# Patient Record
Sex: Male | Born: 1945 | Race: White | Hispanic: No | Marital: Married | Smoking: Never smoker
Health system: Southern US, Community
[De-identification: ages and names within clinical notes are randomized; demographics above are authoritative.]

## PROBLEM LIST (undated history)

## (undated) DIAGNOSIS — R519 Headache, unspecified: Secondary | ICD-10-CM

## (undated) DIAGNOSIS — I10 Essential (primary) hypertension: Secondary | ICD-10-CM

## (undated) DIAGNOSIS — M1990 Unspecified osteoarthritis, unspecified site: Secondary | ICD-10-CM

## (undated) DIAGNOSIS — T8859XA Other complications of anesthesia, initial encounter: Secondary | ICD-10-CM

## (undated) DIAGNOSIS — T4145XA Adverse effect of unspecified anesthetic, initial encounter: Secondary | ICD-10-CM

## (undated) DIAGNOSIS — R51 Headache: Secondary | ICD-10-CM

## (undated) DIAGNOSIS — E785 Hyperlipidemia, unspecified: Secondary | ICD-10-CM

## (undated) DIAGNOSIS — I4891 Unspecified atrial fibrillation: Secondary | ICD-10-CM

## (undated) DIAGNOSIS — I499 Cardiac arrhythmia, unspecified: Secondary | ICD-10-CM

## (undated) HISTORY — PX: NO PAST SURGERIES: SHX2092

## (undated) HISTORY — PX: COLONOSCOPY: SHX174

---

## 2002-06-08 ENCOUNTER — Ambulatory Visit (HOSPITAL_COMMUNITY): Admission: RE | Admit: 2002-06-08 | Discharge: 2002-06-08 | Payer: Self-pay | Admitting: *Deleted

## 2007-07-26 ENCOUNTER — Encounter: Admission: RE | Admit: 2007-07-26 | Discharge: 2007-07-26 | Payer: Self-pay | Admitting: Internal Medicine

## 2007-08-30 ENCOUNTER — Ambulatory Visit (HOSPITAL_COMMUNITY): Admission: RE | Admit: 2007-08-30 | Discharge: 2007-08-30 | Payer: Self-pay | Admitting: *Deleted

## 2010-05-20 NOTE — Op Note (Signed)
Joshua Shields, Joshua Shields                ACCOUNT NO.:  [id number]   MEDICAL RECORD NO.:  [id number]          PATIENT TYPE:  AMB   LOCATION:  ENDO                         FACILITY:  [facility]   PHYSICIAN:  Georgiana Spinner, M.D.    DATE OF BIRTH:  03-11-1945   DATE OF PROCEDURE:  08/30/2007  DATE OF DISCHARGE:                               OPERATIVE REPORT   PROCEDURE:  Colonoscopy.   INDICATIONS:  Colon cancer screening.   ANESTHESIA:  Fentanyl 50 mcg, Versed 6 mg.   PROCEDURE:  With the patient mildly sedated in the left lateral  decubitus position, rectal examination was performed which was  unremarkable. Prostate felt normal. Subsequently, the Pentax videoscopic  colonoscope was inserted in the rectum and passed under direct vision  with pressure to reach the cecum, identified by the ileocecal valve and  appendiceal oropharynx, both of which were photographed. From this  point, the colonoscope was slowly withdrawn, taking circumferential  views of the colonic mucosa, stopping only in the rectum which appeared  normal on direct and showed hemorrhoidal tissue on retroflexed view. The  endoscope was then straightened and withdrawn. The patient's vital signs  and pulse oximetry remained stable. The patient tolerated the procedure  well without apparent complications.   FINDINGS:  Internal hemorrhoids. Otherwise, an unremarkable colonoscopic  examination to the cecum.   PLAN:  Consider repeat examination in 5-10 years.           ______________________________  Georgiana Spinner, M.D.     GMO/MEDQ  D:  08/30/2007  T:  08/30/2007  Job:  [id number]

## 2010-05-23 NOTE — Op Note (Signed)
   Joshua Shields, Joshua Shields                            ACCOUNT NO.:  [id number]   MEDICAL RECORD NO.:  [id number]                   PATIENT TYPE:  AMB   LOCATION:  ENDO                                 FACILITY:  [facility]   PHYSICIAN:  Georgiana Spinner, M.D.                 DATE OF BIRTH:  Oct 04, 1945   DATE OF PROCEDURE:  06/08/2002  DATE OF DISCHARGE:                                 OPERATIVE REPORT   PROCEDURE PERFORMED:  Colonoscopy for screening.   ENDOSCOPIST:  Georgiana Spinner, M.D.   ANESTHESIA:  Demerol 50 mg, Versed 5 mg.   DESCRIPTION OF PROCEDURE:  With the patient mildly sedated in the left  lateral decubitus position, the Olympus video colonoscope was inserted in  the rectum after a normal rectal exam and passed under direct vision to the  cecum, identified by the ileocecal valve and appendiceal orifice, both of  which were photographed.  From this point the colonoscope was slowly  withdrawn taking circumferential views of the entire colonic mucosa stopping  only in the rectum which appeared normal on direct and showed hemorrhoids on  retroflex view.  The endoscope was straightened and withdrawn.  The  patient's vital signs and pulse oximeter remained stable.  The patient  tolerated the procedure well without apparent complications.   FINDINGS:  Internal hemorrhoids.  Otherwise unremarkable examination.   PLAN:  Repeat examination in five to 10 years.                                                 Georgiana Spinner, M.D.    GMO/MEDQ  D:  06/08/2002  T:  06/08/2002  Job:  [id number]

## 2013-08-09 DIAGNOSIS — Z85828 Personal history of other malignant neoplasm of skin: Secondary | ICD-10-CM | POA: Diagnosis not present

## 2013-08-09 DIAGNOSIS — IMO0002 Reserved for concepts with insufficient information to code with codable children: Secondary | ICD-10-CM | POA: Diagnosis not present

## 2013-08-09 DIAGNOSIS — L570 Actinic keratosis: Secondary | ICD-10-CM | POA: Diagnosis not present

## 2013-08-09 DIAGNOSIS — L821 Other seborrheic keratosis: Secondary | ICD-10-CM | POA: Diagnosis not present

## 2014-08-15 DIAGNOSIS — Z85828 Personal history of other malignant neoplasm of skin: Secondary | ICD-10-CM | POA: Diagnosis not present

## 2014-08-15 DIAGNOSIS — D1801 Hemangioma of skin and subcutaneous tissue: Secondary | ICD-10-CM | POA: Diagnosis not present

## 2014-08-15 DIAGNOSIS — L821 Other seborrheic keratosis: Secondary | ICD-10-CM | POA: Diagnosis not present

## 2014-08-15 DIAGNOSIS — L570 Actinic keratosis: Secondary | ICD-10-CM | POA: Diagnosis not present

## 2014-11-20 DIAGNOSIS — R002 Palpitations: Secondary | ICD-10-CM | POA: Diagnosis not present

## 2014-12-04 DIAGNOSIS — E7800 Pure hypercholesterolemia, unspecified: Secondary | ICD-10-CM | POA: Diagnosis not present

## 2014-12-04 DIAGNOSIS — I10 Essential (primary) hypertension: Secondary | ICD-10-CM | POA: Diagnosis not present

## 2014-12-04 DIAGNOSIS — Z125 Encounter for screening for malignant neoplasm of prostate: Secondary | ICD-10-CM | POA: Diagnosis not present

## 2014-12-19 DIAGNOSIS — Z0000 Encounter for general adult medical examination without abnormal findings: Secondary | ICD-10-CM | POA: Diagnosis not present

## 2014-12-19 DIAGNOSIS — Z23 Encounter for immunization: Secondary | ICD-10-CM | POA: Diagnosis not present

## 2014-12-19 DIAGNOSIS — E7800 Pure hypercholesterolemia, unspecified: Secondary | ICD-10-CM | POA: Diagnosis not present

## 2014-12-19 DIAGNOSIS — R002 Palpitations: Secondary | ICD-10-CM | POA: Diagnosis not present

## 2014-12-19 DIAGNOSIS — I10 Essential (primary) hypertension: Secondary | ICD-10-CM | POA: Diagnosis not present

## 2014-12-26 DIAGNOSIS — I10 Essential (primary) hypertension: Secondary | ICD-10-CM | POA: Diagnosis not present

## 2014-12-26 DIAGNOSIS — R002 Palpitations: Secondary | ICD-10-CM | POA: Diagnosis not present

## 2014-12-26 DIAGNOSIS — Z6829 Body mass index (BMI) 29.0-29.9, adult: Secondary | ICD-10-CM | POA: Diagnosis not present

## 2015-01-02 DIAGNOSIS — I10 Essential (primary) hypertension: Secondary | ICD-10-CM | POA: Diagnosis not present

## 2015-01-02 DIAGNOSIS — R002 Palpitations: Secondary | ICD-10-CM | POA: Diagnosis not present

## 2015-01-09 DIAGNOSIS — R002 Palpitations: Secondary | ICD-10-CM | POA: Diagnosis not present

## 2015-01-18 DIAGNOSIS — R002 Palpitations: Secondary | ICD-10-CM | POA: Diagnosis not present

## 2015-01-28 DIAGNOSIS — I480 Paroxysmal atrial fibrillation: Secondary | ICD-10-CM | POA: Diagnosis not present

## 2015-01-28 DIAGNOSIS — I472 Ventricular tachycardia: Secondary | ICD-10-CM | POA: Diagnosis not present

## 2015-01-28 DIAGNOSIS — I471 Supraventricular tachycardia: Secondary | ICD-10-CM | POA: Diagnosis not present

## 2015-01-28 DIAGNOSIS — I10 Essential (primary) hypertension: Secondary | ICD-10-CM | POA: Diagnosis not present

## 2015-02-22 DIAGNOSIS — I4891 Unspecified atrial fibrillation: Secondary | ICD-10-CM | POA: Diagnosis not present

## 2015-03-13 DIAGNOSIS — I10 Essential (primary) hypertension: Secondary | ICD-10-CM | POA: Diagnosis not present

## 2015-03-13 DIAGNOSIS — E785 Hyperlipidemia, unspecified: Secondary | ICD-10-CM | POA: Diagnosis not present

## 2015-03-13 DIAGNOSIS — J329 Chronic sinusitis, unspecified: Secondary | ICD-10-CM | POA: Diagnosis not present

## 2015-03-14 DIAGNOSIS — I10 Essential (primary) hypertension: Secondary | ICD-10-CM | POA: Diagnosis not present

## 2015-03-20 DIAGNOSIS — Z1389 Encounter for screening for other disorder: Secondary | ICD-10-CM | POA: Diagnosis not present

## 2015-03-20 DIAGNOSIS — E785 Hyperlipidemia, unspecified: Secondary | ICD-10-CM | POA: Diagnosis not present

## 2015-03-20 DIAGNOSIS — I10 Essential (primary) hypertension: Secondary | ICD-10-CM | POA: Diagnosis not present

## 2015-03-20 DIAGNOSIS — R002 Palpitations: Secondary | ICD-10-CM | POA: Diagnosis not present

## 2015-03-25 DIAGNOSIS — I10 Essential (primary) hypertension: Secondary | ICD-10-CM | POA: Diagnosis not present

## 2015-03-25 DIAGNOSIS — I472 Ventricular tachycardia: Secondary | ICD-10-CM | POA: Diagnosis not present

## 2015-03-25 DIAGNOSIS — I471 Supraventricular tachycardia: Secondary | ICD-10-CM | POA: Diagnosis not present

## 2015-03-25 DIAGNOSIS — I480 Paroxysmal atrial fibrillation: Secondary | ICD-10-CM | POA: Diagnosis not present

## 2015-07-29 DIAGNOSIS — I10 Essential (primary) hypertension: Secondary | ICD-10-CM | POA: Diagnosis not present

## 2015-07-29 DIAGNOSIS — I480 Paroxysmal atrial fibrillation: Secondary | ICD-10-CM | POA: Diagnosis not present

## 2015-07-29 DIAGNOSIS — I471 Supraventricular tachycardia: Secondary | ICD-10-CM | POA: Diagnosis not present

## 2015-07-29 DIAGNOSIS — I472 Ventricular tachycardia: Secondary | ICD-10-CM | POA: Diagnosis not present

## 2015-07-29 DIAGNOSIS — I4891 Unspecified atrial fibrillation: Secondary | ICD-10-CM

## 2015-07-29 HISTORY — DX: Unspecified atrial fibrillation: I48.91

## 2015-08-13 DIAGNOSIS — L820 Inflamed seborrheic keratosis: Secondary | ICD-10-CM | POA: Diagnosis not present

## 2015-08-13 DIAGNOSIS — L570 Actinic keratosis: Secondary | ICD-10-CM | POA: Diagnosis not present

## 2015-08-13 DIAGNOSIS — D1801 Hemangioma of skin and subcutaneous tissue: Secondary | ICD-10-CM | POA: Diagnosis not present

## 2015-08-13 DIAGNOSIS — Z85828 Personal history of other malignant neoplasm of skin: Secondary | ICD-10-CM | POA: Diagnosis not present

## 2015-08-13 DIAGNOSIS — L821 Other seborrheic keratosis: Secondary | ICD-10-CM | POA: Diagnosis not present

## 2015-09-13 DIAGNOSIS — R002 Palpitations: Secondary | ICD-10-CM | POA: Diagnosis not present

## 2015-09-13 DIAGNOSIS — I10 Essential (primary) hypertension: Secondary | ICD-10-CM | POA: Diagnosis not present

## 2015-09-13 DIAGNOSIS — E785 Hyperlipidemia, unspecified: Secondary | ICD-10-CM | POA: Diagnosis not present

## 2015-09-20 DIAGNOSIS — Z23 Encounter for immunization: Secondary | ICD-10-CM | POA: Diagnosis not present

## 2015-09-20 DIAGNOSIS — E7800 Pure hypercholesterolemia, unspecified: Secondary | ICD-10-CM | POA: Diagnosis not present

## 2015-09-20 DIAGNOSIS — Z125 Encounter for screening for malignant neoplasm of prostate: Secondary | ICD-10-CM | POA: Diagnosis not present

## 2015-09-20 DIAGNOSIS — I10 Essential (primary) hypertension: Secondary | ICD-10-CM | POA: Diagnosis not present

## 2015-10-09 ENCOUNTER — Other Ambulatory Visit (INDEPENDENT_AMBULATORY_CARE_PROVIDER_SITE_OTHER): Payer: Self-pay | Admitting: Otolaryngology

## 2015-10-09 DIAGNOSIS — J0111 Acute recurrent frontal sinusitis: Secondary | ICD-10-CM | POA: Diagnosis not present

## 2015-10-09 DIAGNOSIS — J328 Other chronic sinusitis: Secondary | ICD-10-CM

## 2015-10-09 DIAGNOSIS — J0101 Acute recurrent maxillary sinusitis: Secondary | ICD-10-CM | POA: Diagnosis not present

## 2015-10-09 DIAGNOSIS — J310 Chronic rhinitis: Secondary | ICD-10-CM | POA: Diagnosis not present

## 2015-10-16 ENCOUNTER — Ambulatory Visit
Admission: RE | Admit: 2015-10-16 | Discharge: 2015-10-16 | Disposition: A | Discharge status: Home / Self Care | Payer: Medicare Other | Source: Ambulatory Visit | Attending: Otolaryngology | Admitting: Otolaryngology

## 2015-10-16 DIAGNOSIS — J328 Other chronic sinusitis: Secondary | ICD-10-CM | POA: Diagnosis not present

## 2015-10-23 ENCOUNTER — Other Ambulatory Visit (INDEPENDENT_AMBULATORY_CARE_PROVIDER_SITE_OTHER): Payer: Self-pay | Admitting: Otolaryngology

## 2015-10-23 DIAGNOSIS — J329 Chronic sinusitis, unspecified: Secondary | ICD-10-CM

## 2015-10-23 DIAGNOSIS — J320 Chronic maxillary sinusitis: Secondary | ICD-10-CM | POA: Diagnosis not present

## 2015-10-23 DIAGNOSIS — J322 Chronic ethmoidal sinusitis: Secondary | ICD-10-CM | POA: Diagnosis not present

## 2015-10-23 DIAGNOSIS — J321 Chronic frontal sinusitis: Secondary | ICD-10-CM | POA: Diagnosis not present

## 2015-10-28 ENCOUNTER — Other Ambulatory Visit: Payer: Self-pay | Admitting: Otolaryngology

## 2015-10-30 ENCOUNTER — Ambulatory Visit
Admission: RE | Admit: 2015-10-30 | Discharge: 2015-10-30 | Disposition: A | Discharge status: Home / Self Care | Payer: Medicare Other | Source: Ambulatory Visit | Attending: Otolaryngology | Admitting: Otolaryngology

## 2015-10-30 DIAGNOSIS — J329 Chronic sinusitis, unspecified: Secondary | ICD-10-CM

## 2015-11-12 ENCOUNTER — Encounter (HOSPITAL_BASED_OUTPATIENT_CLINIC_OR_DEPARTMENT_OTHER): Payer: Self-pay | Admitting: *Deleted

## 2015-11-12 NOTE — Progress Notes (Signed)
Patient is on Xarelto 20mg  daily for A-fib. Per Dr Woody Seller his cardiologist, OK to stop Xarelto 5d prior to surgery.

## 2015-11-18 ENCOUNTER — Ambulatory Visit (HOSPITAL_BASED_OUTPATIENT_CLINIC_OR_DEPARTMENT_OTHER): Payer: Medicare Other | Admitting: Anesthesiology

## 2015-11-18 ENCOUNTER — Observation Stay (HOSPITAL_BASED_OUTPATIENT_CLINIC_OR_DEPARTMENT_OTHER)
Admission: RE | Admit: 2015-11-18 | Discharge: 2015-11-19 | Disposition: A | Discharge status: Home / Self Care | Payer: Medicare Other | Source: Ambulatory Visit | Attending: Emergency Medicine | Admitting: Emergency Medicine

## 2015-11-18 ENCOUNTER — Observation Stay (HOSPITAL_BASED_OUTPATIENT_CLINIC_OR_DEPARTMENT_OTHER): Payer: Medicare Other

## 2015-11-18 ENCOUNTER — Encounter (HOSPITAL_COMMUNITY)
Admission: RE | Disposition: A | Discharge status: Home / Self Care | Payer: Self-pay | Source: Ambulatory Visit | Attending: Emergency Medicine

## 2015-11-18 ENCOUNTER — Encounter (HOSPITAL_BASED_OUTPATIENT_CLINIC_OR_DEPARTMENT_OTHER): Payer: Self-pay

## 2015-11-18 ENCOUNTER — Observation Stay (HOSPITAL_COMMUNITY): Payer: Medicare Other

## 2015-11-18 DIAGNOSIS — J329 Chronic sinusitis, unspecified: Secondary | ICD-10-CM | POA: Diagnosis present

## 2015-11-18 DIAGNOSIS — I471 Supraventricular tachycardia: Secondary | ICD-10-CM | POA: Insufficient documentation

## 2015-11-18 DIAGNOSIS — J321 Chronic frontal sinusitis: Secondary | ICD-10-CM | POA: Diagnosis not present

## 2015-11-18 DIAGNOSIS — J343 Hypertrophy of nasal turbinates: Secondary | ICD-10-CM | POA: Insufficient documentation

## 2015-11-18 DIAGNOSIS — J322 Chronic ethmoidal sinusitis: Principal | ICD-10-CM | POA: Insufficient documentation

## 2015-11-18 DIAGNOSIS — I469 Cardiac arrest, cause unspecified: Secondary | ICD-10-CM | POA: Insufficient documentation

## 2015-11-18 DIAGNOSIS — Z7951 Long term (current) use of inhaled steroids: Secondary | ICD-10-CM | POA: Insufficient documentation

## 2015-11-18 DIAGNOSIS — I10 Essential (primary) hypertension: Secondary | ICD-10-CM | POA: Insufficient documentation

## 2015-11-18 DIAGNOSIS — I480 Paroxysmal atrial fibrillation: Secondary | ICD-10-CM

## 2015-11-18 DIAGNOSIS — I959 Hypotension, unspecified: Secondary | ICD-10-CM | POA: Diagnosis not present

## 2015-11-18 DIAGNOSIS — J320 Chronic maxillary sinusitis: Secondary | ICD-10-CM | POA: Diagnosis not present

## 2015-11-18 DIAGNOSIS — Z79899 Other long term (current) drug therapy: Secondary | ICD-10-CM | POA: Diagnosis not present

## 2015-11-18 DIAGNOSIS — E7800 Pure hypercholesterolemia, unspecified: Secondary | ICD-10-CM

## 2015-11-18 DIAGNOSIS — Z7901 Long term (current) use of anticoagulants: Secondary | ICD-10-CM | POA: Insufficient documentation

## 2015-11-18 DIAGNOSIS — R001 Bradycardia, unspecified: Secondary | ICD-10-CM | POA: Diagnosis not present

## 2015-11-18 DIAGNOSIS — I9581 Postprocedural hypotension: Secondary | ICD-10-CM | POA: Diagnosis not present

## 2015-11-18 DIAGNOSIS — E785 Hyperlipidemia, unspecified: Secondary | ICD-10-CM

## 2015-11-18 DIAGNOSIS — I4891 Unspecified atrial fibrillation: Secondary | ICD-10-CM | POA: Diagnosis not present

## 2015-11-18 DIAGNOSIS — R0789 Other chest pain: Secondary | ICD-10-CM | POA: Diagnosis not present

## 2015-11-18 DIAGNOSIS — R0981 Nasal congestion: Secondary | ICD-10-CM

## 2015-11-18 DIAGNOSIS — R079 Chest pain, unspecified: Secondary | ICD-10-CM | POA: Diagnosis not present

## 2015-11-18 HISTORY — DX: Unspecified osteoarthritis, unspecified site: M19.90

## 2015-11-18 HISTORY — DX: Cardiac arrhythmia, unspecified: I49.9

## 2015-11-18 HISTORY — DX: Unspecified atrial fibrillation: I48.91

## 2015-11-18 HISTORY — DX: Hyperlipidemia, unspecified: E78.5

## 2015-11-18 HISTORY — DX: Essential (primary) hypertension: I10

## 2015-11-18 LAB — COMPREHENSIVE METABOLIC PANEL
ALBUMIN: 3.7 g/dL (ref 3.5–5.0)
ALK PHOS: 38 U/L (ref 38–126)
ALT: 33 U/L (ref 17–63)
ANION GAP: 13 (ref 5–15)
AST: 39 U/L (ref 15–41)
BILIRUBIN TOTAL: 1.2 mg/dL (ref 0.3–1.2)
BUN: 19 mg/dL (ref 6–20)
CALCIUM: 8.6 mg/dL — AB (ref 8.9–10.3)
CO2: 25 mmol/L (ref 22–32)
Chloride: 102 mmol/L (ref 101–111)
Creatinine, Ser: 1.21 mg/dL (ref 0.61–1.24)
GFR calc Af Amer: >60 mL/min (ref >60)
GFR, EST NON AFRICAN AMERICAN: 59 mL/min — AB (ref >60)
GLUCOSE: 107 mg/dL — AB (ref 65–99)
Potassium: 4 mmol/L (ref 3.5–5.1)
Sodium: 140 mmol/L (ref 135–145)
TOTAL PROTEIN: 6.5 g/dL (ref 6.5–8.1)

## 2015-11-18 LAB — CBC
HEMATOCRIT: 45.1 % (ref 39.0–52.0)
Hemoglobin: 15.6 g/dL (ref 13.0–17.0)
MCH: 33.6 pg (ref 26.0–34.0)
MCHC: 34.6 g/dL (ref 30.0–36.0)
MCV: 97.2 fL (ref 78.0–100.0)
Platelets: 193 10*3/uL (ref 150–400)
RBC: 4.64 MIL/uL (ref 4.22–5.81)
RDW: 12.9 % (ref 11.5–15.5)
WBC: 12.4 10*3/uL — AB (ref 4.0–10.5)

## 2015-11-18 LAB — ECHOCARDIOGRAM COMPLETE
HEIGHTINCHES: 72 in
Weight: 3464 oz

## 2015-11-18 LAB — TROPONIN I
TROPONIN I: 0.27 ng/mL — AB (ref <0.03)
Troponin I: <0.03 ng/mL (ref <0.03)
Troponin I: 0.15 ng/mL (ref <0.03)

## 2015-11-18 LAB — POCT I-STAT, CHEM 8
BUN: 22 mg/dL — ABNORMAL HIGH (ref 6–20)
BUN: 24 mg/dL — ABNORMAL HIGH (ref 6–20)
CALCIUM ION: 1.19 mmol/L (ref 1.15–1.40)
CALCIUM ION: 1.2 mmol/L (ref 1.15–1.40)
CHLORIDE: 103 mmol/L (ref 101–111)
Chloride: 102 mmol/L (ref 101–111)
Creatinine, Ser: 1 mg/dL (ref 0.61–1.24)
Creatinine, Ser: 1.2 mg/dL (ref 0.61–1.24)
GLUCOSE: 94 mg/dL (ref 65–99)
GLUCOSE: 111 mg/dL — AB (ref 65–99)
HCT: 44 % (ref 39.0–52.0)
HCT: 45 % (ref 39.0–52.0)
Hemoglobin: 15 g/dL (ref 13.0–17.0)
Hemoglobin: 15.3 g/dL (ref 13.0–17.0)
Potassium: 4.4 mmol/L (ref 3.5–5.1)
Potassium: 3.8 mmol/L (ref 3.5–5.1)
SODIUM: 139 mmol/L (ref 135–145)
SODIUM: 141 mmol/L (ref 135–145)
TCO2: 27 mmol/L (ref 0–100)
TCO2: 29 mmol/L (ref 0–100)

## 2015-11-18 LAB — I-STAT TROPONIN, ED: Troponin i, poc: 0.06 ng/mL (ref 0.00–0.08)

## 2015-11-18 LAB — HEPARIN LEVEL (UNFRACTIONATED)

## 2015-11-18 LAB — URINALYSIS, ROUTINE W REFLEX MICROSCOPIC
Bilirubin Urine: NEGATIVE
GLUCOSE, UA: NEGATIVE mg/dL
HGB URINE DIPSTICK: NEGATIVE
Ketones, ur: NEGATIVE mg/dL
Leukocytes, UA: NEGATIVE
Nitrite: NEGATIVE
Protein, ur: 30 mg/dL — AB
SPECIFIC GRAVITY, URINE: 1.005 (ref 1.005–1.030)
pH: 6 (ref 5.0–8.0)

## 2015-11-18 LAB — URINE MICROSCOPIC-ADD ON
Bacteria, UA: NONE SEEN
RBC / HPF: NONE SEEN RBC/hpf (ref 0–5)
SQUAMOUS EPITHELIAL / LPF: NONE SEEN

## 2015-11-18 LAB — TSH: TSH: 1.376 u[IU]/mL (ref 0.350–4.500)

## 2015-11-18 LAB — APTT
aPTT: 26 seconds (ref 24–36)
aPTT: 52 seconds — ABNORMAL HIGH (ref 24–36)

## 2015-11-18 LAB — PHOSPHORUS: PHOSPHORUS: 3.7 mg/dL (ref 2.5–4.6)

## 2015-11-18 LAB — MAGNESIUM: Magnesium: 2 mg/dL (ref 1.7–2.4)

## 2015-11-18 SURGERY — CANCELLED PROCEDURE
Anesthesia: General | Laterality: Right

## 2015-11-18 MED ORDER — ASPIRIN 81 MG PO CHEW
324.0000 mg | CHEWABLE_TABLET | Freq: Once | ORAL | Status: AC
  Administered 2015-11-18: 324 mg via ORAL
  Filled 2015-11-18: qty 4

## 2015-11-18 MED ORDER — LIDOCAINE HCL (CARDIAC) 20 MG/ML IV SOLN
INTRAVENOUS | Status: DC | PRN
Start: 2015-11-18 — End: 2015-11-18
  Administered 2015-11-18 (×2): 100 mg via INTRAVENOUS

## 2015-11-18 MED ORDER — FENTANYL CITRATE (PF) 100 MCG/2ML IJ SOLN
INTRAMUSCULAR | Status: AC
  Filled 2015-11-18: qty 2

## 2015-11-18 MED ORDER — FENTANYL CITRATE (PF) 100 MCG/2ML IJ SOLN: 50.0000 ug | INTRAMUSCULAR | Status: DC | PRN

## 2015-11-18 MED ORDER — CEFAZOLIN SODIUM-DEXTROSE 2-4 GM/100ML-% IV SOLN
INTRAVENOUS | Status: AC
  Filled 2015-11-18: qty 100

## 2015-11-18 MED ORDER — ASPIRIN EC 325 MG PO TBEC
325.0000 mg | DELAYED_RELEASE_TABLET | Freq: Every day | ORAL | Status: DC
  Administered 2015-11-19: 325 mg via ORAL
  Filled 2015-11-18: qty 1

## 2015-11-18 MED ORDER — ACETAMINOPHEN 650 MG RE SUPP: 650.0000 mg | Freq: Four times a day (QID) | RECTAL | Status: DC | PRN

## 2015-11-18 MED ORDER — SUCCINYLCHOLINE CHLORIDE 20 MG/ML IJ SOLN
INTRAMUSCULAR | Status: DC | PRN
  Administered 2015-11-18: 100 mg via INTRAVENOUS

## 2015-11-18 MED ORDER — OXYCODONE HCL 5 MG PO TABS
5.0000 mg | ORAL_TABLET | ORAL | Status: DC | PRN
  Administered 2015-11-18: 5 mg via ORAL
  Filled 2015-11-18: qty 1

## 2015-11-18 MED ORDER — SODIUM CHLORIDE 0.9% FLUSH
3.0000 mL | Freq: Two times a day (BID) | INTRAVENOUS | Status: DC
  Administered 2015-11-18: 3 mL via INTRAVENOUS

## 2015-11-18 MED ORDER — HEPARIN (PORCINE) IN NACL 100-0.45 UNIT/ML-% IJ SOLN
1500.0000 [IU]/kg/h | INTRAMUSCULAR | Status: DC
  Administered 2015-11-18: 14 [IU]/kg/h via INTRAVENOUS
  Filled 2015-11-18: qty 250

## 2015-11-18 MED ORDER — SODIUM CHLORIDE 0.9 % IV BOLUS (SEPSIS): 1000.0000 mL | Freq: Once | INTRAVENOUS | Status: DC

## 2015-11-18 MED ORDER — LIDOCAINE VISCOUS 2 % MT SOLN
15.0000 mL | Freq: Once | OROMUCOSAL | Status: AC
  Administered 2015-11-18: 15 mL via OROMUCOSAL
  Filled 2015-11-18: qty 15

## 2015-11-18 MED ORDER — CLINDAMYCIN PHOSPHATE 900 MG/50ML IV SOLN
INTRAVENOUS | Status: AC
  Filled 2015-11-18: qty 50

## 2015-11-18 MED ORDER — SODIUM CHLORIDE 0.45 % IV SOLN
INTRAVENOUS | Status: DC
  Administered 2015-11-18 – 2015-11-19 (×2): via INTRAVENOUS

## 2015-11-18 MED ORDER — PERFLUTREN LIPID MICROSPHERE
1.0000 mL | INTRAVENOUS | Status: AC | PRN
  Administered 2015-11-18: 2 mL via INTRAVENOUS
  Filled 2015-11-18: qty 10

## 2015-11-18 MED ORDER — DICLOFENAC SODIUM 1 % TD GEL
4.0000 g | Freq: Four times a day (QID) | TRANSDERMAL | Status: DC | PRN
  Filled 2015-11-18: qty 100

## 2015-11-18 MED ORDER — FENTANYL CITRATE (PF) 100 MCG/2ML IJ SOLN
INTRAMUSCULAR | Status: DC | PRN
  Administered 2015-11-18: 100 ug via INTRAVENOUS

## 2015-11-18 MED ORDER — SIMVASTATIN 20 MG PO TABS
20.0000 mg | ORAL_TABLET | Freq: Every day | ORAL | Status: DC
  Administered 2015-11-18: 20 mg via ORAL
  Filled 2015-11-18: qty 1

## 2015-11-18 MED ORDER — MIDAZOLAM HCL 2 MG/2ML IJ SOLN: 1.0000 mg | INTRAMUSCULAR | Status: DC | PRN

## 2015-11-18 MED ORDER — FENTANYL CITRATE (PF) 100 MCG/2ML IJ SOLN
50.0000 ug | INTRAMUSCULAR | Status: DC | PRN
  Administered 2015-11-18 – 2015-11-19 (×8): 50 ug via INTRAVENOUS
  Filled 2015-11-18 (×8): qty 2

## 2015-11-18 MED ORDER — HYDRALAZINE HCL 20 MG/ML IJ SOLN: 5.0000 mg | INTRAMUSCULAR | Status: DC | PRN

## 2015-11-18 MED ORDER — MUPIROCIN 2 % EX OINT
TOPICAL_OINTMENT | CUTANEOUS | Status: AC
  Filled 2015-11-18: qty 22

## 2015-11-18 MED ORDER — SALINE SPRAY 0.65 % NA SOLN: 1.0000 | NASAL | Status: DC | PRN

## 2015-11-18 MED ORDER — FLUTICASONE PROPIONATE 50 MCG/ACT NA SUSP
1.0000 | Freq: Every day | NASAL | Status: DC
  Administered 2015-11-19: 1 via NASAL
  Filled 2015-11-18: qty 16

## 2015-11-18 MED ORDER — LACTATED RINGERS IV SOLN
INTRAVENOUS | Status: DC
  Administered 2015-11-18: 09:00:00 via INTRAVENOUS

## 2015-11-18 MED ORDER — ALBUTEROL SULFATE HFA 108 (90 BASE) MCG/ACT IN AERS
INHALATION_SPRAY | RESPIRATORY_TRACT | Status: DC | PRN
  Administered 2015-11-18 (×2): 2 via RESPIRATORY_TRACT

## 2015-11-18 MED ORDER — LIDOCAINE 2% (20 MG/ML) 5 ML SYRINGE
INTRAMUSCULAR | Status: AC
  Filled 2015-11-18: qty 15

## 2015-11-18 MED ORDER — HYDROMORPHONE HCL 1 MG/ML IJ SOLN: 0.2500 mg | INTRAMUSCULAR | Status: DC | PRN

## 2015-11-18 MED ORDER — LEVOFLOXACIN 750 MG PO TABS
750.0000 mg | ORAL_TABLET | Freq: Every day | ORAL | Status: DC
  Administered 2015-11-18 – 2015-11-19 (×2): 750 mg via ORAL
  Filled 2015-11-18 (×2): qty 1

## 2015-11-18 MED ORDER — PROPOFOL 10 MG/ML IV BOLUS
INTRAVENOUS | Status: DC | PRN
Start: 2015-11-18 — End: 2015-11-18
  Administered 2015-11-18: 200 mg via INTRAVENOUS

## 2015-11-18 MED ORDER — EPINEPHRINE PF 1 MG/10ML IJ SOSY
PREFILLED_SYRINGE | INTRAMUSCULAR | Status: AC
  Filled 2015-11-18: qty 20

## 2015-11-18 MED ORDER — KETOROLAC TROMETHAMINE 30 MG/ML IJ SOLN
15.0000 mg | Freq: Once | INTRAMUSCULAR | Status: AC
  Administered 2015-11-18: 15 mg via INTRAVENOUS
  Filled 2015-11-18: qty 1

## 2015-11-18 MED ORDER — EPINEPHRINE PF 1 MG/ML IJ SOLN
INTRAMUSCULAR | Status: DC | PRN
  Administered 2015-11-18 (×2): .5 mg via INTRAVENOUS

## 2015-11-18 MED ORDER — SCOPOLAMINE 1 MG/3DAYS TD PT72: 1.0000 | MEDICATED_PATCH | Freq: Once | TRANSDERMAL | Status: DC | PRN

## 2015-11-18 MED ORDER — ACETAMINOPHEN 500 MG PO TABS
1000.0000 mg | ORAL_TABLET | Freq: Four times a day (QID) | ORAL | Status: DC | PRN
Start: 2015-11-18 — End: 2015-11-19

## 2015-11-18 MED ORDER — LACTATED RINGERS IV SOLN
INTRAVENOUS | Status: DC
  Administered 2015-11-18: 11:00:00 via INTRAVENOUS

## 2015-11-18 MED ORDER — ONDANSETRON HCL 4 MG PO TABS: 4.0000 mg | ORAL_TABLET | Freq: Four times a day (QID) | ORAL | Status: DC | PRN

## 2015-11-18 MED ORDER — ONDANSETRON HCL 4 MG/2ML IJ SOLN: 4.0000 mg | Freq: Four times a day (QID) | INTRAMUSCULAR | Status: DC | PRN

## 2015-11-18 MED FILL — Medication: Qty: 1 | Status: AC

## 2015-11-18 SURGICAL SUPPLY — 55 items
ATTRACTOMAT 16X20 MAGNETIC DRP (DRAPES) IMPLANT
BLADE RAD40 ROTATE 4M 4 5PK (BLADE) IMPLANT
BLADE RAD40 ROTATE 4M 4MM 5PK (BLADE)
BLADE RAD60 ROTATE M4 4 5PK (BLADE) IMPLANT
BLADE RAD60 ROTATE M4 4MM 5PK (BLADE)
BLADE ROTATE RAD 12 4 M4 (BLADE) IMPLANT
BLADE ROTATE RAD 12 4MM M4 (BLADE)
BLADE ROTATE RAD 40 4 M4 (BLADE) IMPLANT
BLADE ROTATE RAD 40 4MM M4 (BLADE)
BLADE ROTATE TRICUT 4MX13CM M4 (BLADE)
BLADE ROTATE TRICUT 4X13 M4 (BLADE) IMPLANT
BLADE TRICUT ROTATE M4 4 5PK (BLADE) IMPLANT
BLADE TRICUT ROTATE M4 4MM 5PK (BLADE)
BUR HS RAD FRONTAL 3 (BURR) IMPLANT
BUR HS RAD FRONTAL 3MM (BURR)
CANISTER SUC SOCK COL 7IN (MISCELLANEOUS) ×6 IMPLANT
CANISTER SUCT 1200ML W/VALVE (MISCELLANEOUS) ×3 IMPLANT
COAGULATOR SUCT 6 FR SWTCH (ELECTROSURGICAL)
COAGULATOR SUCT 8FR VV (MISCELLANEOUS) IMPLANT
COAGULATOR SUCT SWTCH 10FR 6 (ELECTROSURGICAL) IMPLANT
DECANTER SPIKE VIAL GLASS SM (MISCELLANEOUS) IMPLANT
DRSG NASAL KENNEDY LMNT 8CM (GAUZE/BANDAGES/DRESSINGS) IMPLANT
DRSG NASOPORE 8CM (GAUZE/BANDAGES/DRESSINGS) IMPLANT
DRSG TELFA 3X8 NADH (GAUZE/BANDAGES/DRESSINGS) IMPLANT
ELECT REM PT RETURN 9FT ADLT (ELECTROSURGICAL)
ELECTRODE REM PT RTRN 9FT ADLT (ELECTROSURGICAL) IMPLANT
GLOVE BIO SURGEON STRL SZ7.5 (GLOVE) ×3 IMPLANT
GOWN STRL REUS W/ TWL LRG LVL3 (GOWN DISPOSABLE) ×2 IMPLANT
GOWN STRL REUS W/TWL LRG LVL3 (GOWN DISPOSABLE) ×4
HEMOSTAT SURGICEL 2X14 (HEMOSTASIS) IMPLANT
IV NS 1000ML (IV SOLUTION)
IV NS 1000ML BAXH (IV SOLUTION) IMPLANT
IV NS 500ML (IV SOLUTION) ×4
IV NS 500ML BAXH (IV SOLUTION) ×2 IMPLANT
NEEDLE HYPO 25X1 1.5 SAFETY (NEEDLE) ×3 IMPLANT
NEEDLE PRECISIONGLIDE 27X1.5 (NEEDLE) ×3 IMPLANT
NEEDLE SPNL 25GX3.5 QUINCKE BL (NEEDLE) IMPLANT
NS IRRIG 1000ML POUR BTL (IV SOLUTION) IMPLANT
PACK BASIN DAY SURGERY FS (CUSTOM PROCEDURE TRAY) ×3 IMPLANT
PACK ENT DAY SURGERY (CUSTOM PROCEDURE TRAY) ×3 IMPLANT
PACKING NASAL EPIS 4X2.4 XEROG (MISCELLANEOUS) IMPLANT
SLEEVE SCD COMPRESS KNEE MED (MISCELLANEOUS) IMPLANT
SOLUTION BUTLER CLEAR DIP (MISCELLANEOUS) ×6 IMPLANT
SPLINT NASAL AIRWAY SILICONE (MISCELLANEOUS) IMPLANT
SPONGE GAUZE 2X2 8PLY STER LF (GAUZE/BANDAGES/DRESSINGS) ×1
SPONGE GAUZE 2X2 8PLY STRL LF (GAUZE/BANDAGES/DRESSINGS) ×2 IMPLANT
SPONGE NEURO XRAY DETECT 1X3 (DISPOSABLE) ×3 IMPLANT
TOWEL OR 17X24 6PK STRL BLUE (TOWEL DISPOSABLE) ×3 IMPLANT
TRACKER ENT INSTRUMENT (MISCELLANEOUS) IMPLANT
TRACKER ENT PATIENT (MISCELLANEOUS) IMPLANT
TUBE CONNECTING 20'X1/4 (TUBING) ×1
TUBE CONNECTING 20X1/4 (TUBING) ×2 IMPLANT
TUBE SALEM SUMP 16 FR W/ARV (TUBING) IMPLANT
TUBING STRAIGHTSHOT EPS 5PK (TUBING) IMPLANT
YANKAUER SUCT BULB TIP NO VENT (SUCTIONS) ×3 IMPLANT

## 2015-11-18 NOTE — Progress Notes (Signed)
ANTICOAGULATION CONSULT NOTE - UPDATE  Baseline aPTT is normal and heparin level is undetectable indicating Xarelto has been eliminated. Heparin levels only will be used going forward.  Renold Genta, PharmD, BCPS Clinical Pharmacist Phone for tonight - [place] - [phone] 11/18/2015 6:28 PM

## 2015-11-18 NOTE — ED Notes (Signed)
Bolus of LR continued with EDP approval.

## 2015-11-18 NOTE — ED Notes (Signed)
Pt. And family wishing to talk to EDP. EDP made aware.

## 2015-11-18 NOTE — Anesthesia Procedure Notes (Signed)
Procedure Name: Intubation Date/Time: 11/18/2015 9:42 AM Performed by: Lieutenant Diego Pre-anesthesia Checklist: Patient identified, Emergency Drugs available, Suction available and Patient being monitored Patient Re-evaluated:Patient Re-evaluated prior to inductionOxygen Delivery Method: Circle system utilized Preoxygenation: Pre-oxygenation with 100% oxygen Intubation Type: IV induction Ventilation: Mask ventilation without difficulty Laryngoscope Size: Miller and 2 Grade View: Grade I Tube type: Oral Tube size: 7.0 mm Number of attempts: 1 Airway Equipment and Method: Stylet and Oral airway Placement Confirmation: ETT inserted through vocal cords under direct vision,  positive ETCO2 and breath sounds checked- equal and bilateral Secured at: 24 cm Tube secured with: Tape Dental Injury: Teeth and Oropharynx as per pre-operative assessment

## 2015-11-18 NOTE — ED Provider Notes (Signed)
[facility] DEPT Provider Note   CSN: [id number] Arrival date & time: 11/18/15  1028     History   Chief Complaint Chief Complaint  Patient presents with  . Post CPR    HPI Jayquon Sharrett is a 70 y.o. male.  Underwent anesthesia/intubation for elective ethmoidectomy, decreasing BP and HR until no palpable pulse was thought to have occurred (one physician felt one, someone else did not) so CPR started, epi given and patient's BP improved. Pulse felt. Was subsequently woken from anesthesia, extubated and sent to er for eval. On ROS: mild lateral R chest pain, sore throat. No recent illnesses or other complaints.       Past Medical History:  Diagnosis Date  . A-fib ([place]) 07/29/2015  . Arthritis    hand  . Dysrhythmia    a-fib  . Hyperlipidemia   . Hypertension     There are no active problems to display for this patient.   Past Surgical History:  Procedure Laterality Date  . COLONOSCOPY    . NO PAST SURGERIES         Home Medications    Prior to Admission medications   Medication Sig Start Date End Date Taking? Authorizing Provider  acetaminophen (TYLENOL) 500 MG tablet Take 1,000 mg by mouth every 6 (six) hours as needed for headache.   Yes Historical Provider, MD  fluticasone (FLONASE) 50 MCG/ACT nasal spray Place 1 spray into both nostrils daily.    Yes Historical Provider, MD  lisinopril (PRINIVIL,ZESTRIL) 20 MG tablet Take 20 mg by mouth daily.   Yes Historical Provider, MD  metoprolol succinate (TOPROL-XL) 25 MG 24 hr tablet Take 25 mg by mouth daily.   Yes Historical Provider, MD  rivaroxaban (XARELTO) 20 MG TABS tablet Take 20 mg by mouth daily with supper.   Yes Historical Provider, MD  simvastatin (ZOCOR) 20 MG tablet Take 20 mg by mouth daily.   Yes Historical Provider, MD    Family History History reviewed. No pertinent family history.  Social History Social History  Substance Use Topics  . Smoking status: Never Smoker  . Smokeless tobacco:  Never Used  . Alcohol use Yes     Comment: social     Allergies   Penicillins   Review of Systems Review of Systems  All other systems reviewed and are negative.    Physical Exam Updated Vital Signs BP 91/62   Pulse 73   Temp 98 F (36.7 C) (Oral)   Resp 15   Ht 6' (1.829 m)   Wt 214 lb (97.1 kg)   SpO2 98%   BMI 29.02 kg/m   Physical Exam  Constitutional: He appears well-developed and well-nourished.  HENT:  Head: Normocephalic and atraumatic.  Eyes: Conjunctivae and EOM are normal.  Neck: Normal range of motion.  Cardiovascular: Normal rate.   Pulmonary/Chest: Effort normal. No respiratory distress. He exhibits tenderness (Right lateral).  Abdominal: Soft. He exhibits no distension.  Musculoskeletal: Normal range of motion.  Neurological: He is alert.  Skin: Skin is warm and dry.  Nursing note and vitals reviewed.    ED Treatments / Results  Labs (all labs ordered are listed, but only abnormal results are displayed) Labs Reviewed  POCT I-STAT, CHEM 8 - Abnormal; Notable for the following:       Result Value   BUN 22 (*)    All other components within normal limits  POCT I-STAT, CHEM 8 - Abnormal; Notable for the following:    BUN 24 (*)  Glucose, Bld 111 (*)    All other components within normal limits  CBC  COMPREHENSIVE METABOLIC PANEL  TROPONIN I  I-STAT TROPOININ, ED    EKG  EKG Interpretation None       Radiology No results found.  Procedures Procedures (including critical care time)  Medications Ordered in ED Medications  aspirin chewable tablet 324 mg (not administered)  lactated ringers infusion (not administered)  lidocaine (XYLOCAINE) 2 % viscous mouth solution 15 mL (15 mLs Mouth/Throat Given 11/18/15 1101)     Initial Impression / Assessment and Plan / ED Course  I have reviewed the triage vital signs and the nursing notes.  Pertinent labs & imaging results that were available during my care of the patient were  reviewed by me and considered in my medical decision making (see chart for details).  Clinical Course     70 yo M here s/p CPR/epinephrine. Awake alert. Right sided MSK pain and sore throat likely from CPR and intubation. ECG ok. cxr ok. Labs ok. Troponin ok.  Initially low BP's here but improved with fluids.  Suspect hypotension and thus had thready/no pulse and that as the cause for his CPR, however it is difficult to know so I d/w his cardiologist who will see in hospital, admit to medicine, tele 2/2 prolonged stability while in ED.   Final Clinical Impressions(s) / ED Diagnoses   Final diagnoses:  Cardiac arrest [facility])    New Prescriptions New Prescriptions   No medications on file     Merrily Pew, MD 11/18/15 [phone]

## 2015-11-18 NOTE — Progress Notes (Signed)
ANTICOAGULATION CONSULT NOTE - Initial Consult  Pharmacy Consult for Heparin IV Indication: chest pain/ACS/STEMI  Allergies  Allergen Reactions  . Penicillins Hives    Has patient had a PCN reaction causing immediate rash, facial/tongue/throat swelling, SOB or lightheadedness with hypotension: Yes Has patient had a PCN reaction causing severe rash involving mucus membranes or skin necrosis: No Has patient had a PCN reaction that required hospitalization No Has patient had a PCN reaction occurring within the last 10 years: No If all of the above answers are "NO", then may proceed with Cephalosporin use.     Patient Measurements: Height: 6' (182.9 cm) Weight: 216 lb 8 oz (98.2 kg) IBW/kg (Calculated) : 77.6 Heparin Dosing Weight: 97.4 kg  Vital Signs: Temp: 98.7 F (37.1 C) (11/13 1448) Temp Source: Oral (11/13 1448) BP: 98/72 (11/13 1448) Pulse Rate: 61 (11/13 1415)  Labs:  Recent Labs  11/18/15 0927 11/18/15 1003 11/18/15 1006  HGB 15.0 15.6 15.3  HCT 44.0 45.1 45.0  PLT  --  193  --   CREATININE 1.00 1.21 1.20  TROPONINI  --  <0.03  --     Estimated Creatinine Clearance: 69.5 mL/min (by C-G formula based on SCr of 1.2 mg/dL).   Medical History: Past Medical History:  Diagnosis Date  . A-fib ([place]) 07/29/2015  . Arthritis    hand  . Dysrhythmia    a-fib  . Hyperlipidemia   . Hypertension     Medications:  Prescriptions Prior to Admission  Medication Sig Dispense Refill Last Dose  . acetaminophen (TYLENOL) 500 MG tablet Take 1,000 mg by mouth every 6 (six) hours as needed for headache.   11/17/2015 at Unknown time  . fluticasone (FLONASE) 50 MCG/ACT nasal spray Place 1 spray into both nostrils daily.    Past Week at Unknown time  . lisinopril (PRINIVIL,ZESTRIL) 20 MG tablet Take 20 mg by mouth daily.   11/18/2015 at Unknown time  . metoprolol succinate (TOPROL-XL) 25 MG 24 hr tablet Take 25 mg by mouth daily.   11/18/2015 at 0700  . rivaroxaban (XARELTO)  20 MG TABS tablet Take 20 mg by mouth daily with supper.   11/14/2015 at Unknown time  . simvastatin (ZOCOR) 20 MG tablet Take 20 mg by mouth daily.   11/17/2015 at Unknown time   Scheduled:  . [START ON 11/19/2015] aspirin EC  325 mg Oral Daily  . [START ON 11/19/2015] fluticasone  1 spray Each Nare Daily  . simvastatin  20 mg Oral q1800  . sodium chloride flush  3 mL Intravenous Q12H    Assessment: 70 y.o male came to short stay surgery for sinus surgery due to chronic sinusitis. He was admitted 11/18/15 after emergent resuscitation in the operating field for bradycardia and hypotension suspected secondary to adverse reaction to undergoing general anesthesia.    He has a h/o Afib and takes Xarelto 20mg  daily with supper prior to admission, last taken PTA on 11/14/15 secondary Xarelto held prior to scheduled surgery.  Pharmacy consulted to start IV heparin drip for ACS/STEMI. CBC is within normal limits.  We may need to monitor/adjust heparin rate based on aPTT initially, since oupatient use of Xarelto will increase heparin level,  until effectson heparin level resolved.     Goal of Therapy:  Heparin level = 0.3-0.7 units/ml APTT = 66-102 seconds  Monitor platelets by anticoagulation protocol: Yes   Plan:  STAT baseline  aPTT, Heparin level Heparin drip (no bolus) start 1350 units/hr 8 hours aPTT/HL Daily aPTT, Heparin  level, CBC  Thank you for allowing pharmacy to be part of this patients care team. Nicole Cella, RPh Clinical Pharmacist Pager: [phone] 11/18/2015,3:02 PM

## 2015-11-18 NOTE — ED Notes (Signed)
Pt. Coming from day surgery for reported post CPR. Pt. Intubated in surgery for his sinuses and had a moment of low BP and weak pulses. Pt. Given 1mg  of epi and was extubated. Pt. Transported here. BP 95/56 at this time. Pt. AOX4. EDP at bedside.

## 2015-11-18 NOTE — ED Notes (Signed)
Water given to pt. per Junie Panning [employer] banker, [employer])

## 2015-11-18 NOTE — ED Notes (Signed)
Pt. Systolic of 79 mmHg. EDP made aware.

## 2015-11-18 NOTE — Consult Note (Signed)
CARDIOLOGY CONSULT NOTE  Patient ID: Joshua Shields MRN: [id number] DOB/AGE: 70-12-1945 70 y.o.  Admit date: 11/18/2015 Referring Physician  [facility] service Dr. Linna Darner. Primary Physician:  No primary care provider on file. Reason for Consultation  C Arrest??  HPI: Joshua Shields  who initially presented to [place] in December 2016 for palpitations.  He has history of hypertension and hyperlipidemia.  Event monitor that was performed on 12/26/2014 for 2 weeks and revealed for short runs of PSVT, paroxysmal atrial fibrillation/atrial flutter lasting longest duration of one hour and 12 minutes.  There was also 8 it run of NSVT.  At that time echocardiogram on 01/02/2015 revealed structurally normal heart.  Mild mitral and tricuspid regurgitation without pulmonary hypertension.  Due to very minimal risk factors and patient being fairly active, underwent a routine treadmill excess stress test on 02/22/2015 in which patient exercised for 6 minutes and achieved 85% of MPHR and 7.05 METs.  No evidence of ischemia.  He is being treated medically with metoprolol for his hypertension along with Xarelto for anticoagulation.  He was last seen on 07/29/2015 and is doing well.  Patient presented for rhinoplasty due to recurrent ethmoid and maxillary sinusitis.  While patient was induced anesthesia, he was noted to be bradycardic and anesthesiologist noticed that the cholesterol was not appropriate, could not feel a pulse, and although EKG revealed sinus rhythm with right bundle branch block on telemetry, and was resuscitated with CPR for 1-2 minutes along with administration of 1 mg of epinephrine.  Patient's vital signs immediately improved, he was extubated, was alert and oriented 3, and was feeling well but per protocol and due to need for CPR he was transferred to emergency room for further evaluation.  He was then admitted to the floor for follow-up.  He had been complaining of chest pain since CPR was performed.  EKG in  the emergency room essentially revealed normal sinus rhythm at rate of 80 bpm with right bundle branch block without evidence of ischemia.  But due to chest pain, serial troponins were drawn, it was minimally abnormal, hence presently started on IV heparin.  I was consulted to further give an opinion regarding possible cardiac nature of his presentation.  Presently patient complains of chest pain in the right side of the chest at the site of CPR. His son is from the bedside, no other complaints.  Patient states that today he has felt many episodes of palpitation that initially presented to [place] in 2016.  Past Medical History:  Diagnosis Date  . A-fib ([place]) 07/29/2015  . Arthritis    hand  . Dysrhythmia    a-fib  . Hyperlipidemia   . Hypertension      Past Surgical History:  Procedure Laterality Date  . COLONOSCOPY    . NO PAST SURGERIES       Family History  Problem Relation Age of Onset  . Stroke Mother   . Stroke Father      Social History: Social History   Social History  . Marital status: Married    Spouse name: N/A  . Number of children: N/A  . Years of education: N/A   Occupational History  . Not on file.   Social History Main Topics  . Smoking status: Never Smoker  . Smokeless tobacco: Never Used  . Alcohol use Yes     Comment: social  . Drug use: No  . Sexual activity: Not on file   Other Topics Concern  . Not on file  Social History Narrative  . No narrative on file     Prescriptions Prior to Admission  Medication Sig Dispense Refill Last Dose  . acetaminophen (TYLENOL) 500 MG tablet Take 1,000 mg by mouth every 6 (six) hours as needed for headache.   11/17/2015 at Unknown time  . fluticasone (FLONASE) 50 MCG/ACT nasal spray Place 1 spray into both nostrils daily.    Past Week at Unknown time  . lisinopril (PRINIVIL,ZESTRIL) 20 MG tablet Take 20 mg by mouth daily.   11/18/2015 at Unknown time  . metoprolol succinate (TOPROL-XL) 25 MG 24 hr tablet  Take 25 mg by mouth daily.   11/18/2015 at 0700  . rivaroxaban (XARELTO) 20 MG TABS tablet Take 20 mg by mouth daily with supper.   11/14/2015 at Unknown time  . simvastatin (ZOCOR) 20 MG tablet Take 20 mg by mouth daily.   11/17/2015 at Unknown time     ROS: General: no fevers/chills/night sweats Eyes: no blurry vision, diplopia, or amaurosis ENT: no sore throat or hearing loss Resp: no cough, wheezing, or hemoptysis CV: Chest pain present. No edema or palpitations GI: no abdominal pain, nausea, vomiting, diarrhea, or constipation GU: no dysuria, frequency, or hematuria Skin: no rash Neuro: no headache, numbness, tingling, or weakness of extremities Musculoskeletal: no joint pain or swelling Heme: no bleeding, DVT, or easy bruising Endo: no polydipsia or polyuria    Physical Exam: Blood pressure 98/72, pulse 66, temperature 98.7 F (37.1 C), temperature source Oral, resp. rate 15, height 6' (1.829 m), weight 98.2 kg (216 lb 8 oz), SpO2 97 %.   General appearance: alert, cooperative, appears stated age and no distress Lungs: clear to auscultation bilaterally Chest wall: left sided chest wall tenderness, anterior chest wall Heart: S1, S2 normal and There is a 2/6 ejection systolic murmur in the parasternal border and also right upper sternal border. Abdomen: soft, non-tender; bowel sounds normal; no masses,  no organomegaly Extremities: extremities normal, atraumatic, no cyanosis or edema Pulses: 2+ and symmetric Neurologic: Grossly normal  Labs:   Lab Results  Component Value Date   WBC 12.4 (H) 11/18/2015   HGB 15.3 11/18/2015   HCT 45.0 11/18/2015   MCV 97.2 11/18/2015   PLT 193 11/18/2015    Recent Labs Lab 11/18/15 1003 11/18/15 1006  NA 140 141  K 4.0 3.8  CL 102 102  CO2 25  --   BUN 19 24*  CREATININE 1.21 1.20  CALCIUM 8.6*  --   PROT 6.5  --   BILITOT 1.2  --   ALKPHOS 38  --   ALT 33  --   AST 39  --   GLUCOSE 107* 111*    Lipid Panel  No  results found for: CHOL, TRIG, HDL, CHOLHDL, VLDL, LDLCALC  BNP (last 3 results) No results for input(s): BNP in the last 8760 hours.  HEMOGLOBIN A1C No results found for: HGBA1C, MPG  Cardiac Panel (last 3 results)  Recent Labs  11/18/15 1003 11/18/15 1652  TROPONINI <0.03 0.27*    Lab Results  Component Value Date   TROPONINI 0.27 ([place]) 11/18/2015     TSH  Recent Labs  11/18/15 1652  TSH 1.376    EKG 11/18/2015: Normal sinus rhythm at rate of 80 bpm, normal axis, right bundle branch block.  No evidence of ischemia.  Normal QT interval.  Echocardiogram 11/18/2015: Normal LV systolic function, no signal can valvular abnormality. No pericardial effusion.  Echo: Outpatient: Echo- 01/02/2015 1. Left ventricle cavity is  normal in size. Normal global wall motion. Doppler evidence of grade I (impaired) diastolic dysfunction. Calculated EF 65%. 2. Mild mitral regurgitation. 3. Mild tricuspid regurgitation. No evidence of pulmonary hypertension. Out patient Treadmill exercise stress test 02/22/2015: Indication: A-Fib The patient exercised according to [facility] Protocol, Total time recorded  6:00 min achieving max heart rate of  129 which was  85 % of MPHR for age and  7.05 METS of work.  Normal BP response. Resting ECG showing a RBBB. There was no ST-T changes of ischemia with exercise stress test. Stress terminated due to shortness of breath, right hip pain and  THR >85%  met.   Scheduled Meds: . [START ON 11/19/2015] aspirin EC  325 mg Oral Daily  . [START ON 11/19/2015] fluticasone  1 spray Each Nare Daily  . levofloxacin  750 mg Oral Daily  . simvastatin  20 mg Oral q1800  . sodium chloride flush  3 mL Intravenous Q12H   Continuous Infusions: . sodium chloride 100 mL/hr at 11/18/15 1521  . heparin 14 Units/kg/hr (11/18/15 1700)  . lactated ringers 125 mL/hr at 11/18/15 1125   PRN Meds:.acetaminophen **OR** acetaminophen, fentaNYL (SUBLIMAZE) injection, hydrALAZINE,  ondansetron **OR** ondansetron (ZOFRAN) IV, perflutren lipid microspheres (DEFINITY) IV suspension, sodium chloride  ASSESSMENT AND PLAN:  1.? PEA while being induced with anesthesia, needing CPR and administration of epinephrine. Heart rate was noted to be 52 bpm during this episode per anesthesia.  2. History of atrial fibrillation, atrial flutter and brief NSVT by event monitor. Zio Patch Event: 12/26/2014 for 2 weeks and revealed for short runs of PSVT, paroxysmal atrial fibrillation/atrial flutter lasting longest duration of one hour and 12 minutes.  There was also 8 it run of NSVT.  CHA2DS2-VASCScore: Risk Score  2,  Yearly risk of stroke  2.2. 3. Hypertension  Recommendation: I reviewed his records, patient is now having recurrent episodes of palpitations and on telemetry reveals brief paroxysmal episodes of wide-complex rhythm, patient has underlying right bundle branch block. I suspect he has probably underlying atrial flutter with 2:1 conduction. Do not suspect VT or TDP. Echogram reveals normal LV systolic function without significant valvular abnormalities. Mild troponin elevation could be related to his recent CPR. Agree with continuing with IV heparin for now, unless her troponin continues to rise, I would not recommend cardiac catheterization. I also suspect his episode could've been precipitated by vasovagal/hypotension due to induction of anesthesia. I have discussed the findings with the patient and his son at the bedside, I also discussed with them that he may need coronary angiography if the troponin continues to raise. They're aware of the risk and benefits associated with this. If there is no further rise in serum troponin, he can be discharged home with outpatient follow-up. I will consider nuclear stress testing to improve the sensitivity of ruling out for myocardial ischemia and CAD.  Adrian Prows, MD 11/18/2015, 6:09 PM [facility]. [place] Pager: [phone] Office:  [phone] If no answer Cell [phone]

## 2015-11-18 NOTE — Transfer of Care (Signed)
Immediate Anesthesia Transfer of Care Note  Patient: Joshua Shields  Procedure(s) Performed: Procedure(s): case cancelled  (N/A)  Patient Location: PACU  Anesthesia Type:General  Level of Consciousness: awake, alert  and oriented  Airway & Oxygen Therapy: Patient Spontanous Breathing and Patient connected to face mask oxygen  Post-op Assessment: Report given to RN and Post -op Vital signs reviewed and stable  Post vital signs: Reviewed and stable  Last Vitals:  Vitals:   11/18/15 1030 11/18/15 1036  BP: 95/56 91/56  Pulse: 82 78  Resp: 16 15  Temp:  36.7 C    Last Pain:  Vitals:   11/18/15 1036  TempSrc: Oral  PainSc: 3          Complications: No apparent anesthesia complications

## 2015-11-18 NOTE — ED Notes (Addendum)
Pt. Placed on 2L oxygen via nasal cannula while he rests.

## 2015-11-18 NOTE — ED Notes (Signed)
Pt. Wife reports that clothing and wallet not transported with patient. [facility] contacted at this time.

## 2015-11-18 NOTE — Progress Notes (Signed)
CRITICAL VALUE ALERT  Critical value received:  Troponin   Date of notification:  11/18/15  Time of notification: 1700  Critical value read back:Yes.    Nurse who received alert:  Rainelle Sulewski  MD notified (1st page):  Dr. Einar Gip (at bedside and aware of result)  Time of first page:    MD notified (2nd page):  Time of second page:  Responding MD:  Time MD responded:

## 2015-11-18 NOTE — Anesthesia Preprocedure Evaluation (Addendum)
Anesthesia Evaluation  Patient identified by MRN, date of birth, ID band Patient awake    Reviewed: Allergy & Precautions, NPO status , Patient's Chart, lab work & pertinent test results  Airway Mallampati: II  TM Distance: >3 FB     Dental   Pulmonary neg pulmonary ROS,    breath sounds clear to auscultation       Cardiovascular hypertension, + dysrhythmias Atrial Fibrillation and Supra Ventricular Tachycardia  Rhythm:Regular Rate:Normal     Neuro/Psych    GI/Hepatic negative GI ROS, Neg liver ROS,   Endo/Other  negative endocrine ROS  Renal/GU negative Renal ROS     Musculoskeletal  (+) Arthritis ,   Abdominal   Peds  Hematology   Anesthesia Other Findings   Reproductive/Obstetrics                            Anesthesia Physical Anesthesia Plan  ASA: III  Anesthesia Plan: General   Post-op Pain Management:    Induction: Intravenous  Airway Management Planned: Oral ETT  Additional Equipment:   Intra-op Plan:   Post-operative Plan: Extubation in OR  Informed Consent: I have reviewed the patients History and Physical, chart, labs and discussed the procedure including the risks, benefits and alternatives for the proposed anesthesia with the patient or authorized representative who has indicated his/her understanding and acceptance.   Dental advisory given  Plan Discussed with: CRNA and Anesthesiologist  Anesthesia Plan Comments:         Anesthesia Quick Evaluation

## 2015-11-18 NOTE — H&P (Signed)
History and Physical    Joshua Shields [id number] DOB: 11/17/1945 DOA: 11/18/2015  PCP: No primary care provider on file. Patient coming from: [facility] OR Chief Complaint: bradycardia and hypotension  HPI: Joshua Shields is a 70 y.o. male with medical history significant of Afib, HLD, HTN, chronic sinusitis who presented to short stay surgery She provided by me, anesthesia, and on a limited basis patient. Patient arriving in the ED after emergent resuscitation in the operating field for bradycardia and hypotension. Pro-one of the OR nurses patient did lose his pulse for a short period of time. Epinephrine was given 1 with rapid improvement in pulse and blood pressure. After extubation patient complaining of throat pain and chest pain.  Denies any recent chest pain, shortness of breath, palpitations, nausea, vomiting, abdominal pain, dysuria, frequency, rash, fevers, neck stiffness. Patient does endorse current/chronic sinus congestion.  After brief CPR patient does endorse symptoms of chest wall pain especially with movement or palpation and mild throat irritation/pain.  ED Course: objective findings outlined below.   Review of Systems: As per HPI otherwise 10 point review of systems negative.   Ambulatory Status:no restrictions  Past Medical History:  Diagnosis Date  . A-fib ([place]) 07/29/2015  . Arthritis    hand  . Dysrhythmia    a-fib  . Hyperlipidemia   . Hypertension     Past Surgical History:  Procedure Laterality Date  . COLONOSCOPY    . NO PAST SURGERIES      Social History   Social History  . Marital status: Married    Spouse name: N/A  . Number of children: N/A  . Years of education: N/A   Occupational History  . Not on file.   Social History Main Topics  . Smoking status: Never Smoker  . Smokeless tobacco: Never Used  . Alcohol use Yes     Comment: social  . Drug use: No  . Sexual activity: Not on file   Other Topics Concern  . Not on file   Social  History Narrative  . No narrative on file    Allergies  Allergen Reactions  . Penicillins Hives    Has patient had a PCN reaction causing immediate rash, facial/tongue/throat swelling, SOB or lightheadedness with hypotension: Yes Has patient had a PCN reaction causing severe rash involving mucus membranes or skin necrosis: No Has patient had a PCN reaction that required hospitalization No Has patient had a PCN reaction occurring within the last 10 years: No If all of the above answers are "NO", then may proceed with Cephalosporin use.     Family History  Problem Relation Age of Onset  . Stroke Mother   . Stroke Father     Prior to Admission medications   Medication Sig Start Date End Date Taking? Authorizing Provider  acetaminophen (TYLENOL) 500 MG tablet Take 1,000 mg by mouth every 6 (six) hours as needed for headache.   Yes Historical Provider, MD  fluticasone (FLONASE) 50 MCG/ACT nasal spray Place 1 spray into both nostrils daily.    Yes Historical Provider, MD  lisinopril (PRINIVIL,ZESTRIL) 20 MG tablet Take 20 mg by mouth daily.   Yes Historical Provider, MD  metoprolol succinate (TOPROL-XL) 25 MG 24 hr tablet Take 25 mg by mouth daily.   Yes Historical Provider, MD  rivaroxaban (XARELTO) 20 MG TABS tablet Take 20 mg by mouth daily with supper.   Yes Historical Provider, MD  simvastatin (ZOCOR) 20 MG tablet Take 20 mg by mouth daily.   Yes  Historical Provider, MD    Physical Exam: Vitals:   11/18/15 1315 11/18/15 1345 11/18/15 1350 11/18/15 1415  BP: 96/63 96/85 110/68 100/70  Pulse: 64 71 67 61  Resp: 15 12 10 15   Temp:      TempSrc:      SpO2: 94% 93% 93% 93%  Weight:      Height:         General:  Appears calm and comfortable Eyes:  PERRL, EOMI, normal lids, iris ENT:  grossly normal hearing, lips & tongue, mmm Neck:  no LAD, masses or thyromegaly Cardiovascular:  Regularly irregular,  no m/r/g. No LE edema.  Respiratory:  CTA bilaterally, no w/r/r. Normal  respiratory effort. Abdomen:  soft, ntnd, NABS Skin:  no rash or induration seen on limited exam Musculoskeletal:  Chest wall ttp. grossly normal tone BUE/BLE, good ROM, no bony abnormality Psychiatric:  grossly normal mood and affect, speech fluent and appropriate, AOx3 Neurologic:  CN 2-12 grossly intact, moves all extremities in coordinated fashion, sensation intact  Labs on Admission: I have personally reviewed following labs and imaging studies  CBC:  Recent Labs Lab 11/18/15 0927 11/18/15 1003 11/18/15 1006  WBC  --  12.4*  --   HGB 15.0 15.6 15.3  HCT 44.0 45.1 45.0  MCV  --  97.2  --   PLT  --  193  --    Basic Metabolic Panel:  Recent Labs Lab 11/18/15 0927 11/18/15 1003 11/18/15 1006  NA 139 140 141  K 4.4 4.0 3.8  CL 103 102 102  CO2  --  25  --   GLUCOSE 94 107* 111*  BUN 22* 19 24*  CREATININE 1.00 1.21 1.20  CALCIUM  --  8.6*  --    GFR: Estimated Creatinine Clearance: 69.2 mL/min (by C-G formula based on SCr of 1.2 mg/dL). Liver Function Tests:  Recent Labs Lab 11/18/15 1003  AST 39  ALT 33  ALKPHOS 38  BILITOT 1.2  PROT 6.5  ALBUMIN 3.7   No results for input(s): LIPASE, AMYLASE in the last 168 hours. No results for input(s): AMMONIA in the last 168 hours. Coagulation Profile: No results for input(s): INR, PROTIME in the last 168 hours. Cardiac Enzymes:  Recent Labs Lab 11/18/15 1003  TROPONINI <0.03   BNP (last 3 results) No results for input(s): PROBNP in the last 8760 hours. HbA1C: No results for input(s): HGBA1C in the last 72 hours. CBG: No results for input(s): GLUCAP in the last 168 hours. Lipid Profile: No results for input(s): CHOL, HDL, LDLCALC, TRIG, CHOLHDL, LDLDIRECT in the last 72 hours. Thyroid Function Tests: No results for input(s): TSH, T4TOTAL, FREET4, T3FREE, THYROIDAB in the last 72 hours. Anemia Panel: No results for input(s): VITAMINB12, FOLATE, FERRITIN, TIBC, IRON, RETICCTPCT in the last 72  hours. Urine analysis:    Component Value Date/Time   COLORURINE YELLOW 11/18/2015 1225   APPEARANCEUR CLEAR 11/18/2015 1225   LABSPEC 1.005 11/18/2015 1225   PHURINE 6.0 11/18/2015 1225   GLUCOSEU NEGATIVE 11/18/2015 1225   HGBUR NEGATIVE 11/18/2015 1225   BILIRUBINUR NEGATIVE 11/18/2015 1225   KETONESUR NEGATIVE 11/18/2015 1225   PROTEINUR 30 (A) 11/18/2015 1225   NITRITE NEGATIVE 11/18/2015 1225   LEUKOCYTESUR NEGATIVE 11/18/2015 1225    Creatinine Clearance: Estimated Creatinine Clearance: 69.2 mL/min (by C-G formula based on SCr of 1.2 mg/dL).  Sepsis Labs: @LABRCNTIP (procalcitonin:4,lacticidven:4) )No results found for this or any previous visit (from the past 240 hour(s)).   Radiological Exams on Admission:  No results found.  EKG: Independently reviewed. RBBB, no ACS  Assessment/Plan Active Problems:   Hypotension   Bradycardia   Sinus congestion   HLD (hyperlipidemia)   PAF (paroxysmal atrial fibrillation) (HCC)   Bradycardia/hypotension: h/o HTN and Afib. Suspect secondary to adverse reaction to undergoing general anesthesia. Reported loss of pulse to palpation and innability to get BP on cuff. Brief CPR initiated w/ administration of Epi w/ return of BP and HR. BP remains somewhat soft after IVF. EKG showing RBBB and sinus - baseline. No overt sign of ACS. Trop neg - tele - obs - cycle trop - Echo - EKG in am - f/u Cardiology evaluation - Dr. Woody Seller - pt will need full anesthesia eval prior to future surgical interventions.  - Defer restarting lisinopril and metoprolol to Cardiology - Heparin for anticoagulation (resume or continue to hold Xarelto once new surgery date confirmed) - CXR to r/o rib fracture or pneumothorax from CPR - Voltaren and lidocaine gel, tylenol, fentanyl for CP  Sinus congestion: pt presented to same day surgery for sinus surgery by Dr. Benjamine Mola. Due to adverse anesthesia event surgery was aborted.  - Flonase - continue levaquin - per ENT  notes - nasal saline - further mgt per Dr. Benjamine Mola.   HLD: - continue statin  DVT prophylaxis: Heparin - full dose  Code Status: full  Family Communication: wife  Disposition Plan: pending cardiac eval/workup  Consults called: Cardiologhy - Dr. Woody Seller  Admission status: obs, Tele.     MERRELL, DAVID J MD [facility]  If 7PM-7AM, please contact night-coverage [email or web address] Password [email or web address]  11/18/2015, 2:48 PM

## 2015-11-18 NOTE — OR Nursing (Signed)
After induction patient experienced irregular heartbeat, heart compressions were initiated by Dr. Benjamine Mola and a Code Blue was called. See anesthesia note for follow up. Patient awakened, extubated and transferred to [facility] where he was to be tranferred to [facility] ICU Bonney Leitz RN

## 2015-11-18 NOTE — ED Notes (Signed)
Placed pt on two Liters of Oxygen, per Junie Panning - RN.

## 2015-11-18 NOTE — Anesthesia Postprocedure Evaluation (Signed)
Anesthesia Post Note  Patient: Joshua Shields  Procedure(s) Performed: Procedure(s) (LRB): case cancelled  (N/A)  Patient location during evaluation: PACU Anesthesia Type: General Level of consciousness: awake Pain management: pain level controlled Vital Signs Assessment: post-procedure vital signs reviewed and stable Respiratory status: spontaneous breathing Cardiovascular status: stable Anesthetic complications: yes Anesthetic complication details: anesthesia complications   Last Vitals:  Vitals:   11/18/15 1448 11/18/15 1500  BP: 98/72   Pulse:  66  Resp: 14 15  Temp: 37.1 C     Last Pain:  Vitals:   11/18/15 1700  TempSrc:   PainSc: 8                  EDWARDS,Emilio Baylock

## 2015-11-18 NOTE — H&P (Signed)
Cc: Chronic rhinosinusitis  HPI: The patient is a 70 year old male who returns today for follow-up evaluation of his chronic rhinosinusitis.  The patient was last seen 2 weeks ago.  At that time, he was noted to have purulent drainage in his right nasal cavity.  The patient has a history of chronic rhinosinusitis. He was started on a 30-day course of levofloxacin.  In addition, he was placed on Prednisone Dosepak and daily Nasacort.  He underwent a paranasal sinus CT scan last week.  The CT showed near complete opacification of his right frontal, maxillary and ethmoid sinuses.  The patient returns complaining of persistent right-sided facial pain, worse over his right forehead.  He also complains of a bad odor in his nose.  He has not responded to the medical treatment so far.   Exam: The nasal cavities were decongested and anesthetised with a combination of oxymetazoline and 4% lidocaine solution.  The flexible scope was inserted into the right nasal cavity.  Endoscopy of the inferior and middle meatus was performed.  The edematous mucosa was noted. Purulent drainage noted with obstructed sinus openings.  Olfactory cleft was clear.  Nasopharynx was clear.  Turbinates were hypertrophied but without mass.  Incomplete response to decongestion.  The procedure was repeated on the contralateral side with similar findings.  The patient tolerated the procedure well.  Instructions were given to avoid eating or drinking for 2 hours.   Assessment:  The patient continues to have severe right-sided nasal mucosal congestion and purulent drainage.  His CT scan is consistent with chronic right maxillary, ethmoid and frontal sinusitis.  Plan: 1.  The patient is instructed to complete his current course of levofloxacin.   2.  He should continue with his steroid nasal spray and nasal saline irrigation.   3.  In light of his persistent infection, he will benefit from undergoing surgical intervention with opening of his  right frontal, ethmoid and maxillary sinuses.  The risks, benefits, alternatives and details of the procedures are reviewed with the patient.  4.  The patient would like to proceed with the procedures.

## 2015-11-18 NOTE — Progress Notes (Signed)
Pharmacy Antibiotic Note  Joshua Shields is a 70 y.o. male admitted on 11/18/2015 after emergent resuscitation in the operating field today 11/18/15 for bradycardia and hypotension suspected secondary to adverse reaction to undergoing general anesthesia for sinus surgery at  same day surgery center today.  Pharmacy has been consulted for Levofloxacin dosing for chronic  rhinosinusitis, sinus congestion.  At Otolaryngologist office 2 weeks ago , noted to have purulent drainage in his rt nasal cavity. He was started on a 30-day course of levofloxacin.  In addition, he was placed on Prednisone Dosepak and daily Nasacort.   Here 11/13 to Same day surgery for sinus surgery by Dr. Benjamine Mola, nasal cavities were decongested and anesthetised with a combination of oxymetazoline and 4% lidocaine solution.  WBC 12.4K  SCr 1.2, CrCl ~ 69 ml/min  CT scan is consistent with chronic right maxillary, ethmoid and frontal sinusitis.  Antimicrobials this admission:  Levaquin PTA for 30 day course:  started ~ 11/1>>  (but not on PTA med list)  Continues inpatient 11/13 >>   Plan: Levofloxacin 750 mg PO q24h    Height: 6' (182.9 cm) Weight: 216 lb 8 oz (98.2 kg) IBW/kg (Calculated) : 77.6  Temp (24hrs), Avg:98.4 F (36.9 C), Min:98 F (36.7 C), Max:98.7 F (37.1 C)   Recent Labs Lab 11/18/15 0927 11/18/15 1003 11/18/15 1006  WBC  --  12.4*  --   CREATININE 1.00 1.21 1.20    Estimated Creatinine Clearance: 69.5 mL/min (by C-G formula based on SCr of 1.2 mg/dL).    Allergies  Allergen Reactions  . Penicillins Hives    Has patient had a PCN reaction causing immediate rash, facial/tongue/throat swelling, SOB or lightheadedness with hypotension: Yes Has patient had a PCN reaction causing severe rash involving mucus membranes or skin necrosis: No Has patient had a PCN reaction that required hospitalization No Has patient had a PCN reaction occurring within the last 10 years: No If all of the above  answers are "NO", then may proceed with Cephalosporin use.     Antimicrobials this admission: Levaquin PTA;  started ~ 11/1>> (but not on PTA med list)   inpt 11/13 >>  Dose adjustments this admission:   Microbiology results: none   Thank you for allowing pharmacy to be a part of this patient's care. Nicole Cella, RPh Clinical Pharmacist Pager: [phone] 11/18/2015 3:53 PM

## 2015-11-19 DIAGNOSIS — R0981 Nasal congestion: Secondary | ICD-10-CM

## 2015-11-19 DIAGNOSIS — I480 Paroxysmal atrial fibrillation: Secondary | ICD-10-CM | POA: Diagnosis not present

## 2015-11-19 DIAGNOSIS — R001 Bradycardia, unspecified: Secondary | ICD-10-CM

## 2015-11-19 DIAGNOSIS — J322 Chronic ethmoidal sinusitis: Secondary | ICD-10-CM | POA: Diagnosis not present

## 2015-11-19 DIAGNOSIS — I10 Essential (primary) hypertension: Secondary | ICD-10-CM | POA: Diagnosis not present

## 2015-11-19 DIAGNOSIS — I959 Hypotension, unspecified: Secondary | ICD-10-CM | POA: Diagnosis not present

## 2015-11-19 DIAGNOSIS — I9581 Postprocedural hypotension: Secondary | ICD-10-CM | POA: Diagnosis not present

## 2015-11-19 LAB — HEPARIN LEVEL (UNFRACTIONATED): HEPARIN UNFRACTIONATED: 0.26 [IU]/mL — AB (ref 0.30–0.70)

## 2015-11-19 LAB — BASIC METABOLIC PANEL
ANION GAP: 7 (ref 5–15)
BUN: 18 mg/dL (ref 6–20)
CALCIUM: 8.8 mg/dL — AB (ref 8.9–10.3)
CO2: 23 mmol/L (ref 22–32)
CREATININE: 1.11 mg/dL (ref 0.61–1.24)
Chloride: 106 mmol/L (ref 101–111)
GFR calc Af Amer: >60 mL/min (ref >60)
GLUCOSE: 144 mg/dL — AB (ref 65–99)
Potassium: 4.5 mmol/L (ref 3.5–5.1)
Sodium: 136 mmol/L (ref 135–145)

## 2015-11-19 LAB — CBC
HCT: 41.3 % (ref 39.0–52.0)
Hemoglobin: 13.8 g/dL (ref 13.0–17.0)
MCH: 32.9 pg (ref 26.0–34.0)
MCHC: 33.4 g/dL (ref 30.0–36.0)
MCV: 98.6 fL (ref 78.0–100.0)
PLATELETS: 134 10*3/uL — AB (ref 150–400)
RBC: 4.19 MIL/uL — ABNORMAL LOW (ref 4.22–5.81)
RDW: 13.1 % (ref 11.5–15.5)
WBC: 7.2 10*3/uL (ref 4.0–10.5)

## 2015-11-19 LAB — LIPID PANEL
CHOLESTEROL: 139 mg/dL (ref 0–200)
HDL: 37 mg/dL — ABNORMAL LOW (ref >40)
LDL Cholesterol: 56 mg/dL (ref 0–99)
TRIGLYCERIDES: 230 mg/dL — AB (ref <150)
Total CHOL/HDL Ratio: 3.8 RATIO
VLDL: 46 mg/dL — ABNORMAL HIGH (ref 0–40)

## 2015-11-19 MED ORDER — LEVOFLOXACIN 750 MG PO TABS: 750.0000 mg | ORAL_TABLET | Freq: Every day | ORAL | 0 refills | Status: DC

## 2015-11-19 MED ORDER — OXYCODONE HCL 5 MG PO TABS: 5.0000 mg | ORAL_TABLET | ORAL | 0 refills | Status: DC | PRN

## 2015-11-19 MED ORDER — ASPIRIN 325 MG PO TBEC
325.0000 mg | DELAYED_RELEASE_TABLET | Freq: Every day | ORAL | 0 refills | Status: DC
Start: 2015-11-20 — End: 2016-04-17

## 2015-11-19 MED ORDER — RIVAROXABAN 20 MG PO TABS: 20.0000 mg | ORAL_TABLET | Freq: Every day | ORAL | Status: DC

## 2015-11-19 MED ORDER — DICLOFENAC SODIUM 1 % TD GEL: 4.0000 g | Freq: Four times a day (QID) | TRANSDERMAL | 0 refills | Status: DC | PRN

## 2015-11-19 MED ORDER — OXYCODONE HCL 5 MG PO TABS
5.0000 mg | ORAL_TABLET | ORAL | Status: DC | PRN
  Administered 2015-11-19: 10 mg via ORAL
  Filled 2015-11-19: qty 2

## 2015-11-19 MED ORDER — HEPARIN (PORCINE) IN NACL 100-0.45 UNIT/ML-% IJ SOLN: 1500.0000 [IU]/h | INTRAMUSCULAR | Status: DC

## 2015-11-19 NOTE — Discharge Instructions (Addendum)
Information on my medicine - XARELTO (Rivaroxaban)  This medication education was reviewed with me or my healthcare representative as part of my discharge preparation.  You were on this medication, XARELTO, prior to this hospital admission.   Why was Xarelto prescribed for you? Xarelto was prescribed for you to reduce the risk of a blood clot forming that can cause a stroke if you have a medical condition called atrial fibrillation (a type of irregular heartbeat).  What do you need to know about xarelto ? Take your Xarelto ONCE DAILY at the same time every day with your evening meal. If you have difficulty swallowing the tablet whole, you may crush it and mix in applesauce just prior to taking your dose.  Take Xarelto exactly as prescribed by your doctor and DO NOT stop taking Xarelto without talking to the doctor who prescribed the medication.  Stopping without other stroke prevention medication to take the place of Xarelto may increase your risk of developing a clot that causes a stroke.  Refill your prescription before you run out.  After discharge, you should have regular check-up appointments with your healthcare provider that is prescribing your Xarelto.  In the future your dose may need to be changed if your kidney function or weight changes by a significant amount.  What do you do if you miss a dose? If you are taking Xarelto ONCE DAILY and you miss a dose, take it as soon as you remember on the same day then continue your regularly scheduled once daily regimen the next day. Do not take two doses of Xarelto at the same time or on the same day.   Important Safety Information A possible side effect of Xarelto is bleeding. You should call your healthcare provider right away if you experience any of the following: ? Bleeding from an injury or your nose that does not stop. ? Unusual colored urine (red or dark brown) or unusual colored stools (red or black). ? Unusual bruising  for unknown reasons. ? A serious fall or if you hit your head (even if there is no bleeding).  Some medicines may interact with Xarelto and might increase your risk of bleeding while on Xarelto. To help avoid this, consult your healthcare provider or pharmacist prior to using any new prescription or non-prescription medications, including herbals, vitamins, non-steroidal anti-inflammatory drugs (NSAIDs) and supplements.  This website has more information on Xarelto: [email or web address]

## 2015-11-19 NOTE — Progress Notes (Signed)
[facility] for Heparin  Indication: chest pain/ACS  Allergies  Allergen Reactions  . Penicillins Hives    Has patient had a PCN reaction causing immediate rash, facial/tongue/throat swelling, SOB or lightheadedness with hypotension: Yes Has patient had a PCN reaction causing severe rash involving mucus membranes or skin necrosis: No Has patient had a PCN reaction that required hospitalization No Has patient had a PCN reaction occurring within the last 10 years: No If all of the above answers are "NO", then may proceed with Cephalosporin use.     Patient Measurements: Height: 6' (182.9 cm) Weight: 216 lb 8 oz (98.2 kg) IBW/kg (Calculated) : 77.6 Heparin Dosing Weight: 97.4 kg  Vital Signs: Temp: 98 F (36.7 C) (11/13 1954) Temp Source: Oral (11/13 1954) BP: 111/69 (11/13 1954) Pulse Rate: 65 (11/13 1954)  Labs:  Recent Labs  11/18/15 0927 11/18/15 1003 11/18/15 1006 11/18/15 1652 11/18/15 2101 11/18/15 2319  HGB 15.0 15.6 15.3  --   --   --   HCT 44.0 45.1 45.0  --   --   --   PLT  --  193  --   --   --   --   APTT  --   --   --  26  --  52*  HEPARINUNFRC  --   --   --  <0.10*  --  0.26*  CREATININE 1.00 1.21 1.20  --   --   --   TROPONINI  --  <0.03  --  0.27* 0.15*  --     Estimated Creatinine Clearance: 69.5 mL/min (by C-G formula based on SCr of 1.2 mg/dL).   Assessment: 70 y.o. male s/p cardiac arrest In OR, h/o Afib and Xarelto on hold, for heparin  Goal of Therapy:  Heparin level = 0.3-0.7 units/ml Monitor platelets by anticoagulation protocol: Yes   Plan:  Increase Heparin 1500 unit/shr Follow-up am labs.   Phillis Knack, PharmD, BCPS  11/19/2015,12:17 AM

## 2015-11-19 NOTE — Progress Notes (Signed)
Subjective:  Except for chest pain on the right anterior chest wall, no other complaints.  Objective:  Vital Signs in the last 24 hours: Temp:  [98 F (36.7 C)-99.4 F (37.4 C)] 99.4 F (37.4 C) (11/14 0808) Pulse Rate:  [61-85] 85 (11/14 0413) Resp:  [10-20] 20 (11/14 0413) BP: (79-127)/(53-85) 127/77 (11/14 0808) SpO2:  [91 %-100 %] 100 % (11/14 0413) Weight:  [98.2 kg (216 lb 8 oz)-100.3 kg (221 lb 3.2 oz)] 100.3 kg (221 lb 3.2 oz) (11/14 0413)  Intake/Output from previous day: 11/13 0701 - 11/14 0700 In: 4105.7 [I.V.:4105.7] Out: 3075 [Urine:3075]  Physical Exam:  General appearance: alert, cooperative, appears stated age and no distress Eyes: negative findings: lids and lashes normal Neck: no adenopathy, no carotid bruit, no JVD, supple, symmetrical, trachea midline and thyroid not enlarged, symmetric, no tenderness/mass/nodules Neck: JVP - normal, carotids 2+= without bruits Resp: clear to auscultation bilaterally Chest wall: left sided chest wall tenderness Cardio: regular rate and rhythm, S1, S2 normal, no murmur, click, rub or gallop GI: soft, non-tender; bowel sounds normal; no masses,  no organomegaly Extremities: extremities normal, atraumatic, no cyanosis or edema    Lab Results: BMP  Recent Labs  11/18/15 1003 11/18/15 1006 11/19/15 0809  NA 140 141 136  K 4.0 3.8 4.5  CL 102 102 106  CO2 25  --  23  GLUCOSE 107* 111* 144*  BUN 19 24* 18  CREATININE 1.21 1.20 1.11  CALCIUM 8.6*  --  8.8*  GFRNONAA 59*  --  >60  GFRAA >60  --  >60    CBC  Recent Labs Lab 11/19/15 0809  WBC 7.2  RBC 4.19*  HGB 13.8  HCT 41.3  PLT 134*  MCV 98.6  MCH 32.9  MCHC 33.4  RDW 13.1   Recent Labs  11/18/15 1003 11/18/15 1652 11/18/15 2101  TROPONINI <0.03 0.27* 0.15*    Recent Labs  11/18/15 1652  TSH 1.376   Lipid Panel     Component Value Date/Time   CHOL 139 11/19/2015 0809   TRIG 230 (H) 11/19/2015 0809   HDL 37 (L) 11/19/2015 0809   CHOLHDL 3.8 11/19/2015 0809   VLDL 46 (H) 11/19/2015 0809   LDLCALC 56 11/19/2015 0809    Hepatic Function Panel  Recent Labs  11/18/15 1003  PROT 6.5  ALBUMIN 3.7  AST 39  ALT 33  ALKPHOS 38  BILITOT 1.2    Imaging: Imaging results have been reviewed  Cardiac Studies:  Echocardiogram 11/18/2015: Normal LV systolic function, no signal can valvular abnormality. No pericardial effusion.  Echo: Outpatient: Echo- 01/02/2015 1. Left ventricle cavity is normal in size. Normal global wall motion. Doppler evidence of grade I (impaired) diastolic dysfunction. Calculated EF 65%. 2. Mild mitral regurgitation. 3. Mild tricuspid regurgitation. No evidence of pulmonary hypertension. Out patient Treadmill exercise stress test 02/22/2015: Indication: A-Fib The patient exercised according to [facility] Protocol, Total time recorded 6:00 min achieving max heart rate of 129 which was 85 % of MPHR for age and 7.05 METS of work. Normal BP response. Resting ECG showing a RBBB. There was no ST-T changes of ischemia with exercise stress test. Stress terminated due to shortness of breath, right hip pain and THR >85% met.  EKG: normal EKG, normal sinus rhythm, RBBB, unchanged from previous tracings.  Telemetry: No further episodes of atrial tachycardia. Occasional PVCs and ventricle couplets. No NSVT.   Assessment/Plan:  Okay to discharge the patient, do not suspect cardiac etiology for his hypotension.  I suspect a component of dehydration and volume loss. I'll see her back in the office. Serum troponin probably related to CPR and probable minor contusion. Lipids were previously normal, elevated triglycerides probably due to patient being nonfasting.   Adrian Prows, M.D. 11/19/2015, 10:18 AM [facility] Pager: [phone] Office: [phone] If no answer: [phone]

## 2015-11-19 NOTE — Plan of Care (Signed)
Problem: Education: Goal: Knowledge of [facility] Education information/materials will improve Outcome: Completed/Met Date Met: 11/19/15 Pt educated on tests, procedures, medications, and available resources.   Problem: Safety: Goal: Ability to remain free from injury will improve Outcome: Completed/Met Date Met: 11/19/15 Pt remains free from injury during this admission   Problem: Physical Regulation: Goal: Will remain free from infection Outcome: Completed/Met Date Met: 11/19/15 Pt has remained free from infection during this admission   Problem: Fluid Volume: Goal: Ability to maintain a balanced intake and output will improve Outcome: Completed/Met Date Met: 11/19/15 Pt has adequate intake and output

## 2015-11-19 NOTE — Discharge Summary (Signed)
Physician Discharge Summary  Joshua Shields [id number] DOB: Mar 26, 1945 DOA: 11/18/2015  PCP: No primary care provider on file.  Admit date: 11/18/2015 Discharge date: 11/19/2015   Recommendations for Outpatient Follow-Up:   1. Outpatient ENT follow up 2. Incentive spirometry    Discharge Diagnosis:   Active Problems:   Hypotension   Bradycardia   Sinus congestion   HLD (hyperlipidemia)   PAF (paroxysmal atrial fibrillation) ([place])   Discharge disposition:  Home.  Discharge Condition: Improved.  Diet recommendation: Low sodium, heart healthy  Wound care: None.   History of Present Illness:   Joshua Shields is a 70 y.o. male with medical history significant of Afib, HLD, HTN, chronic sinusitis who presented to short stay surgery She provided by me, anesthesia, and on a limited basis patient. Patient arriving in the ED after emergent resuscitation in the operating field for bradycardia and hypotension. Pro-one of the OR nurses patient did lose his pulse for a short period of time. Epinephrine was given 1 with rapid improvement in pulse and blood pressure. After extubation patient complaining of throat pain and chest pain.  Denies any recent chest pain, shortness of breath, palpitations, nausea, vomiting, abdominal pain, dysuria, frequency, rash, fevers, neck stiffness. Patient does endorse current/chronic sinus congestion.  After brief CPR patient does endorse symptoms of chest wall pain especially with movement or palpation and mild throat irritation/pain.   Hospital Course by Problem:   Bradycardia/hypotension -cards suspect his episode could've been precipitated by vasovagal/hypotension due to induction of anesthesia. -outpatient follow up -incentive spirometry and pain control  HTN -resume home meds   Medical Consultants:    cards   Discharge Exam:   Vitals:   11/19/15 0413 11/19/15 0808  BP: 103/71 127/77  Pulse: 85   Resp: 20   Temp: 98.6 F  (37 C) 99.4 F (37.4 C)   Vitals:   11/18/15 1954 11/19/15 0100 11/19/15 0413 11/19/15 0808  BP: 111/69  103/71 127/77  Pulse: 65 61 85   Resp: 18 13 20    Temp: 98 F (36.7 C) 98.3 F (36.8 C) 98.6 F (37 C) 99.4 F (37.4 C)  TempSrc: Oral Oral Oral Oral  SpO2: 95% 97% 100%   Weight:   100.3 kg (221 lb 3.2 oz)   Height:        Gen:  NAD   The results of significant diagnostics from this hospitalization (including imaging, microbiology, ancillary and laboratory) are listed below for reference.     Procedures and Diagnostic Studies:   Ct Maxillofacial Wo Contrast  Result Date: 10/30/2015 CLINICAL DATA:  Chronic sinusitis for 1 year. EXAM: CT MAXILLOFACIAL WITHOUT CONTRAST TECHNIQUE: Multidetector CT imaging of the maxillofacial structures was performed. Multiplanar CT image reconstructions were also generated. A small metallic BB was placed on the right temple in order to reliably differentiate right from left. COMPARISON:  Limited sinus CT 2 weeks ago. FINDINGS: Osseous: No destructive lesion. Chronic remodeling of the medial RIGHT maxillary sinus wall suggesting antrochoanal polyp. Orbits: Negative.  BILATERAL cataract extraction. Sinuses: Ostiomeatal pattern of obstruction involving the RIGHT frontal sinus, RIGHT anterior ethmoid air cells, and RIGHT maxillary sinus. Osseous thinning and slight erosion of the inferior aspect of the RIGHT-sided anterior ethmoid air cells. Poor visualization of the RIGHT uncinate process. Soft tissues: BILATERAL inferior turbinate hypertrophy. No concha bullosa. No nasal cavity masses. Limited intracranial: Mild atrophy.  No masses. IMPRESSION: Ostiomeatal pattern of obstruction, widening of the RIGHT maxillary sinus ostium, complete opacification of the RIGHT frontal, RIGHT  anterior ethmoid, and RIGHT maxillary sinuses, suggesting antral choanal polyp. Early osseous erosion/remodeling of the adjacent bony septae, particularly in the ethmoid region.  Poor visualization of the RIGHT uncinate process. Electronically Signed   By: Staci Righter M.D.   On: 10/30/2015 13:22     Labs:   Basic Metabolic Panel:  Recent Labs Lab 11/18/15 0927 11/18/15 1003 11/18/15 1006 11/18/15 1652 11/19/15 0809  NA 139 140 141  --  136  K 4.4 4.0 3.8  --  4.5  CL 103 102 102  --  106  CO2  --  25  --   --  23  GLUCOSE 94 107* 111*  --  144*  BUN 22* 19 24*  --  18  CREATININE 1.00 1.21 1.20  --  1.11  CALCIUM  --  8.6*  --   --  8.8*  MG  --   --   --  2.0  --   PHOS  --   --   --  3.7  --    GFR Estimated Creatinine Clearance: 75.9 mL/min (by C-G formula based on SCr of 1.11 mg/dL). Liver Function Tests:  Recent Labs Lab 11/18/15 1003  AST 39  ALT 33  ALKPHOS 38  BILITOT 1.2  PROT 6.5  ALBUMIN 3.7   No results for input(s): LIPASE, AMYLASE in the last 168 hours. No results for input(s): AMMONIA in the last 168 hours. Coagulation profile No results for input(s): INR, PROTIME in the last 168 hours.  CBC:  Recent Labs Lab 11/18/15 0927 11/18/15 1003 11/18/15 1006 11/19/15 0809  WBC  --  12.4*  --  7.2  HGB 15.0 15.6 15.3 13.8  HCT 44.0 45.1 45.0 41.3  MCV  --  97.2  --  98.6  PLT  --  193  --  134*   Cardiac Enzymes:  Recent Labs Lab 11/18/15 1003 11/18/15 1652 11/18/15 2101  TROPONINI <0.03 0.27* 0.15*   BNP: Invalid input(s): POCBNP CBG: No results for input(s): GLUCAP in the last 168 hours. D-Dimer No results for input(s): DDIMER in the last 72 hours. Hgb A1c No results for input(s): HGBA1C in the last 72 hours. Lipid Profile  Recent Labs  11/19/15 0809  CHOL 139  HDL 37*  LDLCALC 56  TRIG 230*  CHOLHDL 3.8   Thyroid function studies  Recent Labs  11/18/15 1652  TSH 1.376   Anemia work up No results for input(s): VITAMINB12, FOLATE, FERRITIN, TIBC, IRON, RETICCTPCT in the last 72 hours. Microbiology No results found for this or any previous visit (from the past 240 hour(s)).   Discharge  Instructions:   Discharge Instructions    Diet - low sodium heart healthy    Complete by:  As directed    Discharge instructions    Complete by:  As directed    Continue levaquin per Dr. Benjamine Mola Incentive spirometry   Increase activity slowly    Complete by:  As directed        Medication List    TAKE these medications   acetaminophen 500 MG tablet Commonly known as:  TYLENOL Take 1,000 mg by mouth every 6 (six) hours as needed for headache.   aspirin 325 MG EC tablet Take 1 tablet (325 mg total) by mouth daily. Start taking on:  11/20/2015   diclofenac sodium 1 % Gel Commonly known as:  VOLTAREN Apply 4 g topically every 6 (six) hours as needed (for chest/rib cage pain).   fluticasone 50 MCG/ACT nasal  spray Commonly known as:  FLONASE Place 1 spray into both nostrils daily.   levofloxacin 750 MG tablet Commonly known as:  LEVAQUIN Take 1 tablet (750 mg total) by mouth daily. Start taking on:  11/20/2015   lisinopril 20 MG tablet Commonly known as:  PRINIVIL,ZESTRIL Take 20 mg by mouth daily.   metoprolol succinate 25 MG 24 hr tablet Commonly known as:  TOPROL-XL Take 25 mg by mouth daily.   oxyCODONE 5 MG immediate release tablet Commonly known as:  Oxy IR/ROXICODONE Take 1-2 tablets (5-10 mg total) by mouth every 4 (four) hours as needed for moderate pain.   rivaroxaban 20 MG Tabs tablet Commonly known as:  XARELTO Take 20 mg by mouth daily with supper.   simvastatin 20 MG tablet Commonly known as:  ZOCOR Take 20 mg by mouth daily.         Time coordinating discharge: 35 min  Signed:  Akiel Fennell U Wagner Tanzi   [facility] 11/19/2015, 11:59 AM

## 2015-11-19 NOTE — Plan of Care (Signed)
Problem: Pain Managment: Goal: General experience of comfort will improve Outcome: Progressing Pt still experiencing pain to the right side of chest. Pt receiving IV medications for pain.

## 2015-11-19 NOTE — Care Management Obs Status (Signed)
[facility] NOTIFICATION   Patient Details  Name: Joshua Shields MRN: [id number] Date of Birth: February 02, 1945   Medicare Observation Status Notification Given:  Yes    Bethena Roys, RN 11/19/2015, 12:32 PM

## 2015-11-19 NOTE — Progress Notes (Signed)
ANTICOAGULATION CONSULT NOTE   Pharmacy Consult for : restart Xarelto Indication: Nonvalvular Atrial Fibrilation  Allergies  Allergen Reactions  . Penicillins Hives    Has patient had a PCN reaction causing immediate rash, facial/tongue/throat swelling, SOB or lightheadedness with hypotension: Yes Has patient had a PCN reaction causing severe rash involving mucus membranes or skin necrosis: No Has patient had a PCN reaction that required hospitalization No Has patient had a PCN reaction occurring within the last 10 years: No If all of the above answers are "NO", then may proceed with Cephalosporin use.     Patient Measurements: Height: 6' (182.9 cm) Weight: 221 lb 3.2 oz (100.3 kg) IBW/kg (Calculated) : 77.6 Heparin Dosing Weight: 97.4 kg  Vital Signs: Temp: 99.4 F (37.4 C) (11/14 0808) Temp Source: Oral (11/14 0808) BP: 127/77 (11/14 0808) Pulse Rate: 85 (11/14 0413)  Labs:  Recent Labs  11/18/15 1003 11/18/15 1006 11/18/15 1652 11/18/15 2101 11/18/15 2319 11/19/15 0809  HGB 15.6 15.3  --   --   --  13.8  HCT 45.1 45.0  --   --   --  41.3  PLT 193  --   --   --   --  134*  APTT  --   --  26  --  52*  --   HEPARINUNFRC  --   --  <0.10*  --  0.26*  --   CREATININE 1.21 1.20  --   --   --  1.11  TROPONINI <0.03  --  0.27* 0.15*  --   --     Estimated Creatinine Clearance: 75.9 mL/min (by C-G formula based on SCr of 1.11 mg/dL).   Assessment: 70 y.o. male s/p cardiac arrest In OR outpatient surgery for chronic sinusitis , h/o Afib and Xarelto was on hold for 5 days PTA. He was started on IV heparin yesterday 11/18/15. Troponin trending down, Heparin IV drip discontinued by cardiologist this AM.. Pharmacist consulted to restart Xarelto today.  PTA dose was Xarelto 20mg  daily with supper, last taken 11/14/15 pta. SCr 1.11 , CrCl 75 ml/min H/H stable, wnl. PLTC down to 134k today. No bleeding noted.   Goal of Therapy:  Monitor platelets by anticoagulation protocol:  Yes   Plan:  Xarelto 20 mg now then tomorrow Xarelto 20mg  daiy with supper.    Nicole Cella, RPh Clinical Pharmacist [phone] Main pharmacy 11/19/2015,10:39 AM

## 2015-11-22 DIAGNOSIS — J329 Chronic sinusitis, unspecified: Secondary | ICD-10-CM | POA: Diagnosis not present

## 2015-11-22 DIAGNOSIS — I10 Essential (primary) hypertension: Secondary | ICD-10-CM | POA: Diagnosis not present

## 2015-11-22 DIAGNOSIS — Z09 Encounter for follow-up examination after completed treatment for conditions other than malignant neoplasm: Secondary | ICD-10-CM | POA: Diagnosis not present

## 2015-11-22 DIAGNOSIS — E7800 Pure hypercholesterolemia, unspecified: Secondary | ICD-10-CM | POA: Diagnosis not present

## 2015-12-09 DIAGNOSIS — R55 Syncope and collapse: Secondary | ICD-10-CM | POA: Diagnosis not present

## 2015-12-09 DIAGNOSIS — I480 Paroxysmal atrial fibrillation: Secondary | ICD-10-CM | POA: Diagnosis not present

## 2015-12-09 DIAGNOSIS — I471 Supraventricular tachycardia: Secondary | ICD-10-CM | POA: Diagnosis not present

## 2015-12-09 DIAGNOSIS — I472 Ventricular tachycardia: Secondary | ICD-10-CM | POA: Diagnosis not present

## 2015-12-12 DIAGNOSIS — I4891 Unspecified atrial fibrillation: Secondary | ICD-10-CM | POA: Diagnosis not present

## 2015-12-12 DIAGNOSIS — R0789 Other chest pain: Secondary | ICD-10-CM | POA: Diagnosis not present

## 2015-12-12 DIAGNOSIS — R55 Syncope and collapse: Secondary | ICD-10-CM | POA: Diagnosis not present

## 2015-12-25 DIAGNOSIS — J320 Chronic maxillary sinusitis: Secondary | ICD-10-CM | POA: Diagnosis not present

## 2015-12-25 DIAGNOSIS — J321 Chronic frontal sinusitis: Secondary | ICD-10-CM | POA: Diagnosis not present

## 2015-12-25 DIAGNOSIS — J322 Chronic ethmoidal sinusitis: Secondary | ICD-10-CM | POA: Diagnosis not present

## 2016-01-03 DIAGNOSIS — J320 Chronic maxillary sinusitis: Secondary | ICD-10-CM | POA: Diagnosis not present

## 2016-01-03 DIAGNOSIS — J321 Chronic frontal sinusitis: Secondary | ICD-10-CM | POA: Diagnosis not present

## 2016-01-07 DIAGNOSIS — R55 Syncope and collapse: Secondary | ICD-10-CM | POA: Diagnosis not present

## 2016-01-28 DIAGNOSIS — I469 Cardiac arrest, cause unspecified: Secondary | ICD-10-CM | POA: Diagnosis not present

## 2016-01-28 DIAGNOSIS — I10 Essential (primary) hypertension: Secondary | ICD-10-CM | POA: Diagnosis not present

## 2016-01-28 DIAGNOSIS — J301 Allergic rhinitis due to pollen: Secondary | ICD-10-CM | POA: Diagnosis not present

## 2016-01-28 DIAGNOSIS — J320 Chronic maxillary sinusitis: Secondary | ICD-10-CM | POA: Diagnosis not present

## 2016-01-28 DIAGNOSIS — J329 Chronic sinusitis, unspecified: Secondary | ICD-10-CM | POA: Diagnosis not present

## 2016-01-28 DIAGNOSIS — J321 Chronic frontal sinusitis: Secondary | ICD-10-CM | POA: Diagnosis not present

## 2016-02-03 DIAGNOSIS — I10 Essential (primary) hypertension: Secondary | ICD-10-CM | POA: Diagnosis not present

## 2016-02-03 DIAGNOSIS — I472 Ventricular tachycardia: Secondary | ICD-10-CM | POA: Diagnosis not present

## 2016-02-03 DIAGNOSIS — I471 Supraventricular tachycardia: Secondary | ICD-10-CM | POA: Diagnosis not present

## 2016-02-03 DIAGNOSIS — I480 Paroxysmal atrial fibrillation: Secondary | ICD-10-CM | POA: Diagnosis not present

## 2016-02-25 DIAGNOSIS — J320 Chronic maxillary sinusitis: Secondary | ICD-10-CM | POA: Diagnosis not present

## 2016-02-25 DIAGNOSIS — J321 Chronic frontal sinusitis: Secondary | ICD-10-CM | POA: Diagnosis not present

## 2016-02-27 ENCOUNTER — Other Ambulatory Visit: Payer: Self-pay | Admitting: Otolaryngology

## 2016-04-03 NOTE — Pre-Procedure Instructions (Signed)
Joshua Shields  04/03/2016      [facility] [address], [place] [address] Phone: [phone] Fax: [phone]  [facility] [address], [place] [address] Phone: [phone] Fax: [phone]    Your procedure is scheduled on Wednesday April 11.  Report to [facility] Admitting at 6;30 A.M.  Call this number if you have problems the morning of surgery:  [phone]   Remember:  Do not eat food or drink liquids after midnight.  Take these medicines the morning of surgery with A SIP OF WATER: metoprolol (lopressor) if needed, flonase if needed, acetaminophen (tylenol) if needed  7 days prior to surgery STOP taking any Aspirin, Aleve, Naproxen, Ibuprofen, Motrin, Advil, Goody's, BC's, all herbal medications, fish oil, and all vitamins  Follow MD's instructions on stopping Xarelto (rivaroxaban)   Do not wear jewelry, make-up or nail polish.  Do not wear lotions, powders, or perfumes, or deoderant.  Do not shave 48 hours prior to surgery.  Men may shave face and neck.  Do not bring valuables to the hospital.  [facility] is not responsible for any belongings or valuables.  Contacts, dentures or bridgework may not be worn into surgery.  Leave your suitcase in the car.  After surgery it may be brought to your room.  For patients admitted to the hospital, discharge time will be determined by your treatment team.  Patients discharged the day of surgery will not be allowed to drive home.    Special instructions:    [place]- Preparing For Surgery  Before surgery, you can play an important role. Because skin is not sterile, your skin needs to be as free of germs as possible. You can reduce the number of germs on your skin by washing with CHG (chlorahexidine gluconate) Soap before surgery.  CHG is an antiseptic  cleaner which kills germs and bonds with the skin to continue killing germs even after washing.  Please do not use if you have an allergy to CHG or antibacterial soaps. If your skin becomes reddened/irritated stop using the CHG.  Do not shave (including legs and underarms) for at least 48 hours prior to first CHG shower. It is OK to shave your face.  Please follow these instructions carefully.   1. Shower the NIGHT BEFORE SURGERY and the MORNING OF SURGERY with CHG.   2. If you chose to wash your hair, wash your hair first as usual with your normal shampoo.  3. After you shampoo, rinse your hair and body thoroughly to remove the shampoo.  4. Use CHG as you would any other liquid soap. You can apply CHG directly to the skin and wash gently with a scrungie or a clean washcloth.   5. Apply the CHG Soap to your body ONLY FROM THE NECK DOWN.  Do not use on open wounds or open sores. Avoid contact with your eyes, ears, mouth and genitals (private parts). Wash genitals (private parts) with your normal soap.  6. Wash thoroughly, paying special attention to the area where your surgery will be performed.  7. Thoroughly rinse your body with warm water from the neck down.  8. DO NOT shower/wash with your normal soap after using and rinsing off the CHG Soap.  9. Pat yourself dry with a CLEAN TOWEL.   10. Wear CLEAN PAJAMAS   11.  Place CLEAN SHEETS on your bed the night of your first shower and DO NOT SLEEP WITH PETS.    Day of Surgery: Do not apply any deodorants/lotions. Please wear clean clothes to the hospital/surgery center.

## 2016-04-06 ENCOUNTER — Inpatient Hospital Stay (HOSPITAL_COMMUNITY)
Admission: RE | Admit: 2016-04-06 | Discharge: 2016-04-06 | Disposition: A | Discharge status: Home / Self Care | Payer: Medicare Other | Source: Ambulatory Visit

## 2016-04-10 NOTE — Pre-Procedure Instructions (Signed)
    Nealy Karapetian  04/10/2016      [facility] [address], [place] [address] Phone: [phone] Fax: [phone]  [facility] [address], [place] [address] Phone: [phone] Fax: [phone]    Your procedure is scheduled on 04/15/16.  Report to [facility] Admitting at 630 A.M.  Call this number if you have problems the morning of surgery:  [phone]   Remember:  Do not eat food or drink liquids after midnight.  Take these medicines the morning of surgery with A SIP OF WATER --tylenol,metroprolol   Do not wear jewelry, make-up or nail polish.  Do not wear lotions, powders, or perfumes, or deoderant.  Do not shave 48 hours prior to surgery.  Men may shave face and neck.  Do not bring valuables to the hospital.  [facility] is not responsible for any belongings or valuables.  Contacts, dentures or bridgework may not be worn into surgery.  Leave your suitcase in the car.  After surgery it may be brought to your room.  For patients admitted to the hospital, discharge time will be determined by your treatment team.  Patients discharged the day of surgery will not be allowed to drive home.   Name and phone number of your driver:   Special instructions:    Please read over the following fact sheets that you were given.

## 2016-04-13 ENCOUNTER — Encounter (HOSPITAL_COMMUNITY)
Admission: RE | Admit: 2016-04-13 | Discharge: 2016-04-13 | Disposition: A | Discharge status: Home / Self Care | Payer: Medicare Other | Source: Ambulatory Visit | Attending: Otolaryngology | Admitting: Otolaryngology

## 2016-04-13 ENCOUNTER — Encounter (HOSPITAL_COMMUNITY): Payer: Self-pay

## 2016-04-13 DIAGNOSIS — I480 Paroxysmal atrial fibrillation: Secondary | ICD-10-CM | POA: Insufficient documentation

## 2016-04-13 DIAGNOSIS — Z01812 Encounter for preprocedural laboratory examination: Secondary | ICD-10-CM | POA: Insufficient documentation

## 2016-04-13 DIAGNOSIS — Z79899 Other long term (current) drug therapy: Secondary | ICD-10-CM | POA: Diagnosis not present

## 2016-04-13 DIAGNOSIS — I4510 Unspecified right bundle-branch block: Secondary | ICD-10-CM | POA: Insufficient documentation

## 2016-04-13 DIAGNOSIS — Z7982 Long term (current) use of aspirin: Secondary | ICD-10-CM | POA: Diagnosis not present

## 2016-04-13 DIAGNOSIS — I493 Ventricular premature depolarization: Secondary | ICD-10-CM | POA: Insufficient documentation

## 2016-04-13 HISTORY — DX: Headache, unspecified: R51.9

## 2016-04-13 HISTORY — DX: Adverse effect of unspecified anesthetic, initial encounter: T41.45XA

## 2016-04-13 HISTORY — DX: Headache: R51

## 2016-04-13 HISTORY — DX: Other complications of anesthesia, initial encounter: T88.59XA

## 2016-04-13 LAB — BASIC METABOLIC PANEL
ANION GAP: 8 (ref 5–15)
BUN: 19 mg/dL (ref 6–20)
CHLORIDE: 107 mmol/L (ref 101–111)
CO2: 22 mmol/L (ref 22–32)
Calcium: 9 mg/dL (ref 8.9–10.3)
Creatinine, Ser: 1.05 mg/dL (ref 0.61–1.24)
GFR calc non Af Amer: >60 mL/min (ref >60)
GLUCOSE: 96 mg/dL (ref 65–99)
Potassium: 4 mmol/L (ref 3.5–5.1)
Sodium: 137 mmol/L (ref 135–145)

## 2016-04-13 LAB — CBC
HEMATOCRIT: 46.3 % (ref 39.0–52.0)
HEMOGLOBIN: 16.1 g/dL (ref 13.0–17.0)
MCH: 33.7 pg (ref 26.0–34.0)
MCHC: 34.8 g/dL (ref 30.0–36.0)
MCV: 96.9 fL (ref 78.0–100.0)
Platelets: 159 10*3/uL (ref 150–400)
RBC: 4.78 MIL/uL (ref 4.22–5.81)
RDW: 12.8 % (ref 11.5–15.5)
WBC: 6.2 10*3/uL (ref 4.0–10.5)

## 2016-04-13 NOTE — Progress Notes (Signed)
F/U with Shelby Dubin done,re past cardiac arrest.

## 2016-04-13 NOTE — Progress Notes (Addendum)
Anesthesia PAT Evaluation: Patient is a 71 year old male scheduled for right maxillary antrostomy with tissue removal, right total ethmoidectomy, frontal recess exploration, sinus endo with fusion on 04/17/16 (moved from 04/15/16) by Dr. Benjamine Mola.   Case was originally scheduled on 11/18/15, but he developed significant hypotension and bradycardia with thready pulse and EKG changes (afib with RBBB in the 80's to rate in the 40's with concern for VT due to wide QRS) within five minutes of induction and requiring two minutes of CPR and Epinephrine and later albuterol and repeat lidocaine. The procedure was cancelled, he was extubated and transferred to [facility] ED via [place]. He was evaluated by his cardiologist Dr. Einar Gip [facility]) who did not suspect cardiac etiology and therefore did not recommend a cardiac cath. He did have mildly elevated troponins (<0.03-->0.27-->0.15) felt due to chest compressions. Dr. Einar Gip suspected his episode, "could've been precipitated by vasovagal/hypotension due to induction of anesthesia." Patient did have a non-ischemic stress test and 30 day event monitor following hospital discharge, and these did not reveal any cardiac etiology.  He last saw cardiologist Dr. Vear Clock on 02/03/16, and he wrote, "patient is planning to have sinus surgery, there is no contraindication from cardiac standpoint." Patient has had limited exposure to anesthesia. He reported a colonoscopy several years ago without incident. The 11/18/15 sinus surgery was this first time under general anesthesia. Meds given by anesthesia on 11/18/15 prior to CPR included sevoflurance, propofol, fentanyl, lidocaine, succinylcholine. There was not documentation about any elevated temperature or difficultly extubating. He denied known family history of anesthesia complications.   PCP is Dr. Jani Gravel with [facility].   Meds include ASA (no longer taking since on Xarelto), Xarelto (last dose  04/12/16), Zocor, lisinopril, Lopressor 25 mg daily PRN palpitations/tachycardia, Flonase, Zocor.   BP 119/74   Pulse 76   Temp 36.8 C   Resp 20   Ht 6' (1.829 m)   Wt 220 lb 4.8 oz (99.9 kg)   SpO2 98%   BMI 29.88 kg/m  Exam shows a pleasant Caucasian male in NAD. Heart sounded regular. No murmur noted. Lungs clear. Denied edema. Mallampati II.  EKG 02/03/16 [facility] [place] [facility]): SR with frequent PACs (bigeminy at times), RAD, right BBB,   Nuclear stress test 12/12/15: Impression: 1. The resting ECG demonstrated normal sinus rhythm, right bundle branch block and no resting arrhythmias. Stress ECG is nondiagnostic for ischemia as it is a pharmacologic stress during Lexiscan. There were PACs and one 3 beat atrial ectopic beats during recovery phase of Lexiscan infusion. 2. Myocardial perfusion imaging is normal. Overall left ventricular systolic function was normal without regional wall motion abnormalities. The left ventricular ejection fraction was 77%.  Event Monitor 12/09/15-01/07/16 [facility] CV): Minimum heart rate in sinus rhythm was 48 bpm. Maximum heart rate with A. fib with RVR was 160 bpm. Occasional PACs, PVCs. Few runs of PAF with RVR. Occasional SB. Patient reported rapid, fast heartbeats during the runs of afib. He reported "flutter or skipping" during PVCs.   Echo 11/18/15: Study Conclusions - Left ventricle: The cavity size was normal. There was mild   concentric hypertrophy. Systolic function was normal. The   estimated ejection fraction was in the range of 55% to 60%. Wall   motion was normal; there were no regional wall motion   abnormalities. Doppler parameters are consistent with abnormal   left ventricular relaxation (grade 1 diastolic dysfunction).   There was no evidence of elevated ventricular filling pressure by  Doppler parameters. - Aortic valve: Trileaflet; normal thickness leaflets. There was no   regurgitation. - Aortic root: The aortic root was normal in  size. - Mitral valve: Structurally normal valve. There was no   regurgitation. - Left atrium: The atrium was normal in size. - Right ventricle: The cavity size was normal. Wall thickness was   normal. Systolic function was normal. - Tricuspid valve: There was no regurgitation. - Pulmonic valve: There was no regurgitation. - Pulmonary arteries: Systolic pressure could not be accurately   estimated. - Inferior vena cava: The vessel was normal in size. - Pericardium, extracardiac: There was no pericardial effusion.  CXR 11/18/15: IMPRESSION: No acute cardiopulmonary disease. Colonic interposition over the liver. Given history of CPR performed on the patient, compression of the these bowel loops against the liver might potentially be a source of right-sided pain.  Preoperative labs noted. CBC and BMET WNL. His last Xarelto was 04/12/16, so he will get a PT/INR on the day of surgery.  I offered to have patient speak with an anesthesiologist while at PAT, but he was okay to talk with his assigned anesthesiologist on the day of surgery in hopes his anesthesiologist would have a chance to review above information and 11/18/15 anesthesia records by then. I have reviewed above with anesthesiologist Dr. Linna Caprice. Anesthesiologist to discuss definitive anesthesia plan on the day of surgery. Consider intra-operative a-line.    Joshua Shields [facility] Short Stay Center/Anesthesiology Phone [phone] 04/13/2016 4:49 PM

## 2016-04-14 DIAGNOSIS — I10 Essential (primary) hypertension: Secondary | ICD-10-CM | POA: Diagnosis not present

## 2016-04-14 DIAGNOSIS — J301 Allergic rhinitis due to pollen: Secondary | ICD-10-CM | POA: Diagnosis not present

## 2016-04-14 DIAGNOSIS — Z125 Encounter for screening for malignant neoplasm of prostate: Secondary | ICD-10-CM | POA: Diagnosis not present

## 2016-04-17 ENCOUNTER — Ambulatory Visit (HOSPITAL_COMMUNITY): Payer: Medicare Other | Admitting: Certified Registered Nurse Anesthetist

## 2016-04-17 ENCOUNTER — Ambulatory Visit (HOSPITAL_COMMUNITY): Payer: Medicare Other | Admitting: Vascular Surgery

## 2016-04-17 ENCOUNTER — Encounter (HOSPITAL_COMMUNITY)
Admission: RE | Disposition: A | Discharge status: Home / Self Care | Payer: Self-pay | Source: Ambulatory Visit | Attending: Otolaryngology

## 2016-04-17 ENCOUNTER — Ambulatory Visit (HOSPITAL_COMMUNITY)
Admission: RE | Admit: 2016-04-17 | Discharge: 2016-04-17 | Disposition: A | Discharge status: Home / Self Care | Payer: Medicare Other | Source: Ambulatory Visit | Attending: Otolaryngology | Admitting: Otolaryngology

## 2016-04-17 ENCOUNTER — Encounter (HOSPITAL_COMMUNITY): Payer: Self-pay | Admitting: *Deleted

## 2016-04-17 DIAGNOSIS — J338 Other polyp of sinus: Secondary | ICD-10-CM | POA: Diagnosis not present

## 2016-04-17 DIAGNOSIS — J329 Chronic sinusitis, unspecified: Secondary | ICD-10-CM | POA: Diagnosis not present

## 2016-04-17 DIAGNOSIS — J321 Chronic frontal sinusitis: Secondary | ICD-10-CM | POA: Insufficient documentation

## 2016-04-17 DIAGNOSIS — R0981 Nasal congestion: Secondary | ICD-10-CM | POA: Diagnosis not present

## 2016-04-17 DIAGNOSIS — Z79899 Other long term (current) drug therapy: Secondary | ICD-10-CM | POA: Insufficient documentation

## 2016-04-17 DIAGNOSIS — J322 Chronic ethmoidal sinusitis: Secondary | ICD-10-CM | POA: Insufficient documentation

## 2016-04-17 DIAGNOSIS — J320 Chronic maxillary sinusitis: Secondary | ICD-10-CM | POA: Insufficient documentation

## 2016-04-17 DIAGNOSIS — I4510 Unspecified right bundle-branch block: Secondary | ICD-10-CM | POA: Insufficient documentation

## 2016-04-17 DIAGNOSIS — Z7982 Long term (current) use of aspirin: Secondary | ICD-10-CM | POA: Insufficient documentation

## 2016-04-17 DIAGNOSIS — J343 Hypertrophy of nasal turbinates: Secondary | ICD-10-CM | POA: Insufficient documentation

## 2016-04-17 DIAGNOSIS — Z7901 Long term (current) use of anticoagulants: Secondary | ICD-10-CM | POA: Diagnosis not present

## 2016-04-17 HISTORY — PX: SINUS ENDO WITH FUSION: SHX5329

## 2016-04-17 HISTORY — PX: MAXILLARY ANTROSTOMY: SHX2003

## 2016-04-17 HISTORY — PX: FRONTAL SINUS EXPLORATION: SHX6591

## 2016-04-17 HISTORY — PX: ETHMOIDECTOMY: SHX5197

## 2016-04-17 LAB — PROTIME-INR
INR: 1.04
Prothrombin Time: 13.6 seconds (ref 11.4–15.2)

## 2016-04-17 SURGERY — MAXILLARY ANTROSTOMY
Anesthesia: General | Site: Nose | Laterality: Right

## 2016-04-17 MED ORDER — HYDROMORPHONE HCL 1 MG/ML IJ SOLN
INTRAMUSCULAR | Status: AC
  Administered 2016-04-17: 0.5 mg via INTRAVENOUS
  Filled 2016-04-17: qty 0.5

## 2016-04-17 MED ORDER — OXYCODONE HCL 5 MG PO TABS
ORAL_TABLET | ORAL | Status: AC
  Administered 2016-04-17: 10 mg via ORAL
  Filled 2016-04-17: qty 2

## 2016-04-17 MED ORDER — CLINDAMYCIN HCL 300 MG PO CAPS: 300.0000 mg | ORAL_CAPSULE | Freq: Four times a day (QID) | ORAL | 0 refills | Status: AC

## 2016-04-17 MED ORDER — MIDAZOLAM HCL 2 MG/2ML IJ SOLN
INTRAMUSCULAR | Status: AC
  Filled 2016-04-17: qty 2

## 2016-04-17 MED ORDER — PROPOFOL 10 MG/ML IV BOLUS
INTRAVENOUS | Status: AC
  Filled 2016-04-17: qty 20

## 2016-04-17 MED ORDER — BACITRACIN ZINC 500 UNIT/GM EX OINT
TOPICAL_OINTMENT | CUTANEOUS | Status: DC | PRN
  Administered 2016-04-17: 1 via TOPICAL

## 2016-04-17 MED ORDER — LIDOCAINE-EPINEPHRINE 1 %-1:100000 IJ SOLN
INTRAMUSCULAR | Status: DC | PRN
  Administered 2016-04-17: .09 mL

## 2016-04-17 MED ORDER — PROPOFOL 10 MG/ML IV BOLUS
INTRAVENOUS | Status: DC | PRN
  Administered 2016-04-17: 120 mg via INTRAVENOUS
  Administered 2016-04-17: 40 mg via INTRAVENOUS

## 2016-04-17 MED ORDER — SUGAMMADEX SODIUM 200 MG/2ML IV SOLN
INTRAVENOUS | Status: DC | PRN
  Administered 2016-04-17: 200 mg via INTRAVENOUS

## 2016-04-17 MED ORDER — LIDOCAINE-EPINEPHRINE 1 %-1:100000 IJ SOLN
INTRAMUSCULAR | Status: AC
  Filled 2016-04-17: qty 1

## 2016-04-17 MED ORDER — HYDROMORPHONE HCL 1 MG/ML IJ SOLN
0.5000 mg | INTRAMUSCULAR | Status: DC | PRN
  Administered 2016-04-17 (×2): 0.5 mg via INTRAVENOUS

## 2016-04-17 MED ORDER — ONDANSETRON HCL 4 MG/2ML IJ SOLN
INTRAMUSCULAR | Status: AC
  Filled 2016-04-17: qty 2

## 2016-04-17 MED ORDER — ACETAMINOPHEN 325 MG PO TABS
ORAL_TABLET | ORAL | Status: AC
  Administered 2016-04-17: 650 mg via ORAL
  Filled 2016-04-17: qty 2

## 2016-04-17 MED ORDER — ACETAMINOPHEN 325 MG PO TABS
650.0000 mg | ORAL_TABLET | Freq: Once | ORAL | Status: AC
  Administered 2016-04-17: 650 mg via ORAL

## 2016-04-17 MED ORDER — OXYCODONE HCL 5 MG PO TABS
10.0000 mg | ORAL_TABLET | Freq: Once | ORAL | Status: AC
  Administered 2016-04-17: 10 mg via ORAL

## 2016-04-17 MED ORDER — OXYMETAZOLINE HCL 0.05 % NA SOLN
NASAL | Status: AC
  Filled 2016-04-17: qty 15

## 2016-04-17 MED ORDER — EPHEDRINE 5 MG/ML INJ
INTRAVENOUS | Status: AC
  Filled 2016-04-17: qty 10

## 2016-04-17 MED ORDER — EPHEDRINE SULFATE 50 MG/ML IJ SOLN
INTRAMUSCULAR | Status: DC | PRN
  Administered 2016-04-17 (×3): 5 mg via INTRAVENOUS

## 2016-04-17 MED ORDER — DEXAMETHASONE SODIUM PHOSPHATE 10 MG/ML IJ SOLN
INTRAMUSCULAR | Status: AC
  Filled 2016-04-17: qty 1

## 2016-04-17 MED ORDER — BACITRACIN-NEOMYCIN-POLYMYXIN 400-5-5000 EX OINT
TOPICAL_OINTMENT | CUTANEOUS | Status: AC
  Filled 2016-04-17: qty 1

## 2016-04-17 MED ORDER — PHENYLEPHRINE HCL 10 MG/ML IJ SOLN
INTRAMUSCULAR | Status: DC | PRN
  Administered 2016-04-17: 80 ug via INTRAVENOUS
  Administered 2016-04-17 (×3): 40 ug via INTRAVENOUS
  Administered 2016-04-17: 80 ug via INTRAVENOUS

## 2016-04-17 MED ORDER — FENTANYL CITRATE (PF) 100 MCG/2ML IJ SOLN
INTRAMUSCULAR | Status: DC | PRN
  Administered 2016-04-17: 100 ug via INTRAVENOUS
  Administered 2016-04-17: 50 ug via INTRAVENOUS

## 2016-04-17 MED ORDER — ROCURONIUM BROMIDE 10 MG/ML (PF) SYRINGE
PREFILLED_SYRINGE | INTRAVENOUS | Status: DC | PRN
  Administered 2016-04-17: 50 mg via INTRAVENOUS
  Administered 2016-04-17 (×2): 20 mg via INTRAVENOUS

## 2016-04-17 MED ORDER — LIDOCAINE 2% (20 MG/ML) 5 ML SYRINGE
INTRAMUSCULAR | Status: AC
  Filled 2016-04-17: qty 5

## 2016-04-17 MED ORDER — CLINDAMYCIN PHOSPHATE 600 MG/50ML IV SOLN
900.0000 mg | INTRAVENOUS | Status: AC
  Administered 2016-04-17: 900 mg via INTRAVENOUS
  Filled 2016-04-17: qty 100

## 2016-04-17 MED ORDER — ROCURONIUM BROMIDE 50 MG/5ML IV SOSY
PREFILLED_SYRINGE | INTRAVENOUS | Status: AC
  Filled 2016-04-17: qty 10

## 2016-04-17 MED ORDER — OXYMETAZOLINE HCL 0.05 % NA SOLN
NASAL | Status: DC | PRN
  Administered 2016-04-17 (×2): 1 via TOPICAL

## 2016-04-17 MED ORDER — SUGAMMADEX SODIUM 200 MG/2ML IV SOLN
INTRAVENOUS | Status: AC
  Filled 2016-04-17: qty 2

## 2016-04-17 MED ORDER — ONDANSETRON HCL 4 MG/2ML IJ SOLN: 4.0000 mg | Freq: Once | INTRAMUSCULAR | Status: DC | PRN

## 2016-04-17 MED ORDER — 0.9 % SODIUM CHLORIDE (POUR BTL) OPTIME: TOPICAL | Status: DC | PRN

## 2016-04-17 MED ORDER — LIDOCAINE 2% (20 MG/ML) 5 ML SYRINGE
INTRAMUSCULAR | Status: DC | PRN
  Administered 2016-04-17: 60 mg via INTRAVENOUS

## 2016-04-17 MED ORDER — OXYCODONE HCL 5 MG/5ML PO SOLN: 5.0000 mg | Freq: Once | ORAL | Status: DC | PRN

## 2016-04-17 MED ORDER — HYDROMORPHONE HCL 1 MG/ML IJ SOLN
INTRAMUSCULAR | Status: AC
  Filled 2016-04-17: qty 0.5

## 2016-04-17 MED ORDER — ONDANSETRON HCL 4 MG/2ML IJ SOLN
INTRAMUSCULAR | Status: DC | PRN
  Administered 2016-04-17: 4 mg via INTRAVENOUS

## 2016-04-17 MED ORDER — HYDROMORPHONE HCL 1 MG/ML IJ SOLN
0.2500 mg | INTRAMUSCULAR | Status: DC | PRN
  Administered 2016-04-17 (×2): 0.5 mg via INTRAVENOUS

## 2016-04-17 MED ORDER — OXYCODONE HCL 5 MG PO TABS: 5.0000 mg | ORAL_TABLET | Freq: Once | ORAL | Status: DC | PRN

## 2016-04-17 MED ORDER — SODIUM CHLORIDE 0.9 % IR SOLN
Status: DC | PRN
  Administered 2016-04-17: 1000 mL

## 2016-04-17 MED ORDER — DEXAMETHASONE SODIUM PHOSPHATE 10 MG/ML IJ SOLN
INTRAMUSCULAR | Status: DC | PRN
  Administered 2016-04-17: 10 mg via INTRAVENOUS

## 2016-04-17 MED ORDER — FENTANYL CITRATE (PF) 250 MCG/5ML IJ SOLN
INTRAMUSCULAR | Status: AC
  Filled 2016-04-17: qty 5

## 2016-04-17 MED ORDER — LACTATED RINGERS IV SOLN
INTRAVENOUS | Status: DC
  Administered 2016-04-17 (×2): via INTRAVENOUS

## 2016-04-17 MED ORDER — BACITRACIN ZINC 500 UNIT/GM EX OINT
TOPICAL_OINTMENT | CUTANEOUS | Status: AC
  Filled 2016-04-17: qty 28.35

## 2016-04-17 SURGICAL SUPPLY — 60 items
BLADE 10 SAFETY STRL DISP (BLADE) ×4 IMPLANT
BLADE RAD40 ROTATE 4M 4 5PK (BLADE) IMPLANT
BLADE RAD40 ROTATE 4M 4MM 5PK (BLADE)
BLADE RAD60 ROTATE M4 4 5PK (BLADE) IMPLANT
BLADE RAD60 ROTATE M4 4MM 5PK (BLADE)
BLADE ROTATE TRICUT 4MX13CM M4 (BLADE) ×1
BLADE ROTATE TRICUT 4X13 M4 (BLADE) ×3 IMPLANT
BLADE SURG 15 STRL LF DISP TIS (BLADE) IMPLANT
BLADE SURG 15 STRL SS (BLADE)
BLADE TRICUT 4MM (BLADE) IMPLANT
BLADE TRICUT ROTATE M4 4 5PK (BLADE) IMPLANT
BLADE TRICUT ROTATE M4 4MM 5PK (BLADE)
CANISTER SUCT 3000ML PPV (MISCELLANEOUS) ×4 IMPLANT
COAGULATOR SUCT 6 FR SWTCH (ELECTROSURGICAL)
COAGULATOR SUCT SWTCH 10FR 6 (ELECTROSURGICAL) IMPLANT
DECANTER SPIKE VIAL GLASS SM (MISCELLANEOUS) ×4 IMPLANT
DRAPE HALF SHEET 40X57 (DRAPES) IMPLANT
DRSG NASOPORE 8CM (GAUZE/BANDAGES/DRESSINGS) ×4 IMPLANT
ELECT COATED BLADE 2.86 ST (ELECTRODE) ×4 IMPLANT
ELECT REM PT RETURN 9FT ADLT (ELECTROSURGICAL) ×4
ELECTRODE REM PT RTRN 9FT ADLT (ELECTROSURGICAL) ×2 IMPLANT
FILTER ARTHROSCOPY CONVERTOR (FILTER) ×4 IMPLANT
FLOSEAL 10ML (HEMOSTASIS) IMPLANT
GAUZE SPONGE 2X2 8PLY STRL LF (GAUZE/BANDAGES/DRESSINGS) ×2 IMPLANT
GAUZE SPONGE 4X4 16PLY XRAY LF (GAUZE/BANDAGES/DRESSINGS) ×8 IMPLANT
GLOVE BIO SURGEON STRL SZ 6.5 (GLOVE) ×3 IMPLANT
GLOVE BIO SURGEON STRL SZ7.5 (GLOVE) ×4 IMPLANT
GLOVE BIO SURGEONS STRL SZ 6.5 (GLOVE) ×1
GLOVE BIOGEL PI IND STRL 7.0 (GLOVE) ×2 IMPLANT
GLOVE BIOGEL PI INDICATOR 7.0 (GLOVE) ×2
GLOVE ECLIPSE 7.5 STRL STRAW (GLOVE) ×4 IMPLANT
GOWN STRL REUS W/ TWL LRG LVL3 (GOWN DISPOSABLE) ×4 IMPLANT
GOWN STRL REUS W/TWL LRG LVL3 (GOWN DISPOSABLE) ×4
KIT BASIN OR (CUSTOM PROCEDURE TRAY) ×4 IMPLANT
KIT ROOM TURNOVER OR (KITS) ×4 IMPLANT
MARKER SKIN DUAL TIP RULER LAB (MISCELLANEOUS) ×4 IMPLANT
NEEDLE HYPO 25GX1X1/2 BEV (NEEDLE) ×4 IMPLANT
NEEDLE SPNL 25GX3.5 QUINCKE BL (NEEDLE) ×4 IMPLANT
NS IRRIG 1000ML POUR BTL (IV SOLUTION) ×4 IMPLANT
PACK EENT II TURBAN DRAPE (CUSTOM PROCEDURE TRAY) ×4 IMPLANT
PAD ARMBOARD 7.5X6 YLW CONV (MISCELLANEOUS) ×8 IMPLANT
PENCIL BUTTON HOLSTER BLD 10FT (ELECTRODE) IMPLANT
SOL PREP POV-IOD 4OZ 10% (MISCELLANEOUS) IMPLANT
SOLUTION ANTI FOG 6CC (MISCELLANEOUS) IMPLANT
SPLINT NASAL DOYLE BI-VL (GAUZE/BANDAGES/DRESSINGS) ×4 IMPLANT
SPONGE GAUZE 2X2 STER 10/PKG (GAUZE/BANDAGES/DRESSINGS) ×2
SPONGE NEURO XRAY DETECT 1X3 (DISPOSABLE) ×4 IMPLANT
SUT PLAIN 4 0 ~~LOC~~ 1 (SUTURE) ×4 IMPLANT
SYR CONTROL 10ML LL (SYRINGE) IMPLANT
TOWEL OR 17X24 6PK STRL BLUE (TOWEL DISPOSABLE) ×4 IMPLANT
TRACKER ENT INSTRUMENT (MISCELLANEOUS) ×4 IMPLANT
TRACKER ENT PATIENT (MISCELLANEOUS) ×4 IMPLANT
TRAY ENT MC OR (CUSTOM PROCEDURE TRAY) ×4 IMPLANT
TUBE CONNECTING 12'X1/4 (SUCTIONS) ×1
TUBE CONNECTING 12X1/4 (SUCTIONS) ×3 IMPLANT
TUBE SALEM SUMP 16 FR W/ARV (TUBING) ×4 IMPLANT
TUBING EXTENTION W/L.L. (IV SETS) ×4 IMPLANT
TUBING STRAIGHTSHOT EPS 5PK (TUBING) ×4 IMPLANT
WATER STERILE IRR 1000ML POUR (IV SOLUTION) ×4 IMPLANT
YANKAUER SUCT BULB TIP NO VENT (SUCTIONS) ×4 IMPLANT

## 2016-04-17 NOTE — Anesthesia Postprocedure Evaluation (Addendum)
Anesthesia Post Note  Patient: Salik Grewell  Procedure(s) Performed: Procedure(s) (LRB): RIGHT MAXILLARY ANTROSTOMY WITH TISSUE REMOVAL (Right) RIGHT TOTAL ETHMOIDECTOMY (Right) FRONTAL RECESS EXPLORATION (N/A) SINUS ENDO WITH FUSION (N/A)  Patient location during evaluation: PACU Anesthesia Type: General Level of consciousness: awake, awake and alert and oriented Pain management: pain level controlled Vital Signs Assessment: post-procedure vital signs reviewed and stable Respiratory status: spontaneous breathing, respiratory function stable, nonlabored ventilation and patient connected to nasal cannula oxygen Cardiovascular status: blood pressure returned to baseline Anesthetic complications: no       Last Vitals:  Vitals:   04/17/16 0957  BP: (!) 148/90  Pulse: 74  Resp: 18  Temp: 36.9 C    Last Pain:  Vitals:   04/17/16 0957  TempSrc: Oral                 Breyana Follansbee COKER

## 2016-04-17 NOTE — Discharge Instructions (Signed)

## 2016-04-17 NOTE — Anesthesia Procedure Notes (Signed)
Procedure Name: Intubation Date/Time: 04/17/2016 11:52 AM Performed by: Judeth Cornfield T Pre-anesthesia Checklist: Patient identified, Emergency Drugs available, Suction available, Patient being monitored and Timeout performed Patient Re-evaluated:Patient Re-evaluated prior to inductionOxygen Delivery Method: Circle system utilized Preoxygenation: Pre-oxygenation with 100% oxygen Intubation Type: IV induction Ventilation: Mask ventilation without difficulty and Oral airway inserted - appropriate to patient size Laryngoscope Size: Miller and 3 Grade View: Grade II Tube type: Oral Tube size: 7.5 mm Number of attempts: 1 Placement Confirmation: ETT inserted through vocal cords under direct vision,  breath sounds checked- equal and bilateral and positive ETCO2 Secured at: 22 cm Tube secured with: Tape Dental Injury: Teeth and Oropharynx as per pre-operative assessment  Comments: Grade 2 with cricoid pressure

## 2016-04-17 NOTE — Transfer of Care (Signed)
Immediate Anesthesia Transfer of Care Note  Patient: Joshua Shields  Procedure(s) Performed: Procedure(s): RIGHT MAXILLARY ANTROSTOMY WITH TISSUE REMOVAL (Right) RIGHT TOTAL ETHMOIDECTOMY (Right) FRONTAL RECESS EXPLORATION (N/A) SINUS ENDO WITH FUSION (N/A)  Patient Location: PACU  Anesthesia Type:General  Level of Consciousness: awake, patient cooperative and responds to stimulation  Airway & Oxygen Therapy: Patient Spontanous Breathing and Patient connected to nasal cannula oxygen  Post-op Assessment: Report given to RN and Post -op Vital signs reviewed and stable  Post vital signs: Reviewed and stable  Last Vitals:  Vitals:   04/17/16 0957  BP: (!) 148/90  Pulse: 74  Resp: 18  Temp: 36.9 C    Last Pain:  Vitals:   04/17/16 0957  TempSrc: Oral         Complications: No apparent anesthesia complications

## 2016-04-17 NOTE — Anesthesia Preprocedure Evaluation (Addendum)
Anesthesia Evaluation  Patient identified by MRN, date of birth, ID band Patient awake    Reviewed: Allergy & Precautions, NPO status , Patient's Chart, lab work & pertinent test results  Airway Mallampati: II   Neck ROM: Full    Dental  (+) Teeth Intact, Dental Advisory Given   Pulmonary    breath sounds clear to auscultation       Cardiovascular hypertension,  Rhythm:Irregular Rate:Normal     Neuro/Psych    GI/Hepatic   Endo/Other    Renal/GU      Musculoskeletal   Abdominal   Peds  Hematology   Anesthesia Other Findings   Reproductive/Obstetrics                             Anesthesia Physical Anesthesia Plan  ASA: III  Anesthesia Plan: General   Post-op Pain Management:    Induction: Intravenous  Airway Management Planned:   Additional Equipment:   Intra-op Plan:   Post-operative Plan:   Informed Consent: I have reviewed the patients History and Physical, chart, labs and discussed the procedure including the risks, benefits and alternatives for the proposed anesthesia with the patient or authorized representative who has indicated his/her understanding and acceptance.   Dental advisory given  Plan Discussed with: CRNA and Anesthesiologist  Anesthesia Plan Comments: (Chronic Sinusitis )       Anesthesia Quick Evaluation

## 2016-04-17 NOTE — H&P (Signed)
CC: Chronic right-sided rhinosinusitis  HPI: The patient is a 71 year old male who returns today for follow-up evaluation of his chronic rhinosinusitis.  The patient has a history of chronic purulent drainage in his right nasal cavity.  The patient was treated with multiple courses of antibiotics, including a 30-day course of levofloxacin.  In addition, he was placed on Prednisone Dosepak and daily Nasacort.  He underwent a paranasal sinus CT scan.  The CT showed near complete opacification of his right frontal, maxillary and ethmoid sinuses.  The patient returns complaining of persistent right-sided facial pain, worse over his right forehead.  He also complains of a bad odor in his nose.  He has not responded to the medical treatment so far.   Exam: The nasal cavities were decongested and anesthetised with a combination of oxymetazoline and 4% lidocaine solution.  The flexible scope was inserted into the right nasal cavity.  Endoscopy of the inferior and middle meatus was performed.  The edematous mucosa was noted. Purulent drainage noted with obstructed sinus openings.  Olfactory cleft was clear.  Nasopharynx was clear.  Turbinates were hypertrophied but without mass.  Incomplete response to decongestion.  The procedure was repeated on the contralateral side with similar findings.  The patient tolerated the procedure well.  Instructions were given to avoid eating or drinking for 2 hours.   Assessment:  The patient continues to have severe right-sided nasal mucosal congestion and purulent drainage.  His CT scan is consistent with chronic right maxillary, ethmoid and frontal sinusitis.  Plan: 1.  The patient is instructed to complete his current course of levofloxacin.   2.  He should continue with his steroid nasal spray and nasal saline irrigation.   3.  In light of his persistent infection, he will benefit from undergoing surgical intervention with opening of his right frontal, ethmoid and maxillary  sinuses.  The risks, benefits, alternatives and details of the procedures are reviewed with the patient.  4.  The patient would like to proceed with the procedures.

## 2016-04-17 NOTE — Op Note (Signed)
DATE OF PROCEDURE: 04/17/2016  OPERATIVE REPORT   SURGEON: Leta Baptist, MD   PREOPERATIVE DIAGNOSES:  1. Chronic right maxillary, ethmoid, and frontal sinusitis 2. Right sinonasal polyposis  POSTOPERATIVE DIAGNOSES:  1. Chronic right maxillary, ethmoid, and frontal sinusitis 2. Right sinonasal polyposis.   PROCEDURE PERFORMED:  1. Right endoscopic total ethmoidectomy and frontal sinusotomy. 2. Right endoscopic maxillary antrostomy with polyp removal 3. FUSION stereotactic image guidance  ANESTHESIA: General endotracheal tube anesthesia.   COMPLICATIONS: None.   ESTIMATED BLOOD LOSS: 200 mL.   INDICATION FOR PROCEDURE: Joshua Shields is a 71 y.o. male with a history of chronic right sided rhinosinusitis. The patient has been experiencing persistent right sided headache and facial pain for more than one year. He was previously treated with multiple courses of extended antibiotics. On his CT scan, he was noted to have significant opacification of his right maxillary, ethmoid, and frontal sinuses. On exam, polypoid tissue was noted to obstruct the right middle meatus.  Based on the above findings, the decision was made for the patient to undergo the above-stated procedures. The risks, benefits, alternatives, and details of the procedure were discussed with the patient. Questions were invited and answered. Informed consent was obtained.   DESCRIPTION OF PROCEDURE: The patient was taken to the operating room and placed supine on the operating table. General endotracheal tube anesthesia was administered by the anesthesiologist. The patient was positioned, and prepped and draped in the standard fashion for nasal surgery. Pledgets soaked with Afrin were placed in both nasal cavities for decongestion. The pledgets were subsequently removed. The FUSION stereotactic image guidance marker was placed. The image guidance system was functional throughout the case.  Using a 0 endoscope, the right nasal cavity  was examined. A large amount of purulent drainage was noted from the right middle meatus. The right middle meatus was also obstructed with polypoid tissue. The right middle turbinate was carefully medialized. Polypoid tissue was removed from the middle meatus using a combination of Blakesley forceps and microdebrider. The uncinate process was resected with a freer elevator. The maxillary antrum was entered and enlarged. Polypoid tissue was removed from the maxillary sinus. A large amount of purulent drainage was also noted. The right maxillary sinus was copiously irrigated.  The right anterior and posterior ethmoid cavities were then opened. Polypoid tissue was also removed. The frontal recess was enlarged and copiously irrigated. A large amount of purulent fluid was also irrigated from the right frontal sinus. Hemostasis was achieved with the NASOPORE packing.  The care of the patient was turned over to the anesthesiologist. The patient was awakened from anesthesia without difficulty. The patient was extubated and transferred to the recovery room in good condition.   OPERATIVE FINDINGS: Right chronic maxillary, ethmoid, and frontal rhinosinusitis with polyposis.  SPECIMEN: Right sinus contents.  FOLLOWUP CARE: The patient be discharged home once he is awake and alert. The patient will be placed on clindamycin 300 mg by mouth 4 times a day for 5 days. The patient will follow up in my office in approximately 1 week.  Logann Whitebread Raynelle Bring, MD

## 2016-04-18 ENCOUNTER — Encounter (HOSPITAL_COMMUNITY): Payer: Self-pay | Admitting: Otolaryngology

## 2016-04-21 DIAGNOSIS — I4891 Unspecified atrial fibrillation: Secondary | ICD-10-CM | POA: Diagnosis not present

## 2016-04-21 DIAGNOSIS — E7800 Pure hypercholesterolemia, unspecified: Secondary | ICD-10-CM | POA: Diagnosis not present

## 2016-04-21 DIAGNOSIS — L820 Inflamed seborrheic keratosis: Secondary | ICD-10-CM | POA: Diagnosis not present

## 2016-04-21 DIAGNOSIS — I10 Essential (primary) hypertension: Secondary | ICD-10-CM | POA: Diagnosis not present

## 2016-04-21 DIAGNOSIS — Z7901 Long term (current) use of anticoagulants: Secondary | ICD-10-CM | POA: Diagnosis not present

## 2016-04-22 DIAGNOSIS — J320 Chronic maxillary sinusitis: Secondary | ICD-10-CM | POA: Diagnosis not present

## 2016-04-22 DIAGNOSIS — J322 Chronic ethmoidal sinusitis: Secondary | ICD-10-CM | POA: Diagnosis not present

## 2016-04-22 DIAGNOSIS — J321 Chronic frontal sinusitis: Secondary | ICD-10-CM | POA: Diagnosis not present

## 2016-04-27 DIAGNOSIS — Z1212 Encounter for screening for malignant neoplasm of rectum: Secondary | ICD-10-CM | POA: Diagnosis not present

## 2016-04-27 DIAGNOSIS — Z1211 Encounter for screening for malignant neoplasm of colon: Secondary | ICD-10-CM | POA: Diagnosis not present

## 2016-05-06 DIAGNOSIS — J322 Chronic ethmoidal sinusitis: Secondary | ICD-10-CM | POA: Diagnosis not present

## 2016-05-06 DIAGNOSIS — J321 Chronic frontal sinusitis: Secondary | ICD-10-CM | POA: Diagnosis not present

## 2016-05-06 DIAGNOSIS — J320 Chronic maxillary sinusitis: Secondary | ICD-10-CM | POA: Diagnosis not present

## 2016-05-27 DIAGNOSIS — J322 Chronic ethmoidal sinusitis: Secondary | ICD-10-CM | POA: Diagnosis not present

## 2016-05-27 DIAGNOSIS — J320 Chronic maxillary sinusitis: Secondary | ICD-10-CM | POA: Diagnosis not present

## 2016-05-27 DIAGNOSIS — J321 Chronic frontal sinusitis: Secondary | ICD-10-CM | POA: Diagnosis not present

## 2016-06-08 DIAGNOSIS — I480 Paroxysmal atrial fibrillation: Secondary | ICD-10-CM | POA: Diagnosis not present

## 2016-06-08 DIAGNOSIS — I471 Supraventricular tachycardia: Secondary | ICD-10-CM | POA: Diagnosis not present

## 2016-06-08 DIAGNOSIS — I10 Essential (primary) hypertension: Secondary | ICD-10-CM | POA: Diagnosis not present

## 2016-06-08 DIAGNOSIS — I472 Ventricular tachycardia: Secondary | ICD-10-CM | POA: Diagnosis not present

## 2016-06-09 DIAGNOSIS — H26491 Other secondary cataract, right eye: Secondary | ICD-10-CM | POA: Diagnosis not present

## 2016-06-09 DIAGNOSIS — Z961 Presence of intraocular lens: Secondary | ICD-10-CM | POA: Diagnosis not present

## 2016-06-09 DIAGNOSIS — H04551 Acquired stenosis of right nasolacrimal duct: Secondary | ICD-10-CM | POA: Diagnosis not present

## 2016-06-18 DIAGNOSIS — H02419 Mechanical ptosis of unspecified eyelid: Secondary | ICD-10-CM | POA: Diagnosis not present

## 2016-06-18 DIAGNOSIS — H04551 Acquired stenosis of right nasolacrimal duct: Secondary | ICD-10-CM | POA: Diagnosis not present

## 2016-06-18 DIAGNOSIS — H04221 Epiphora due to insufficient drainage, right lacrimal gland: Secondary | ICD-10-CM | POA: Diagnosis not present

## 2016-08-04 DIAGNOSIS — I4891 Unspecified atrial fibrillation: Secondary | ICD-10-CM | POA: Diagnosis not present

## 2016-08-04 DIAGNOSIS — E785 Hyperlipidemia, unspecified: Secondary | ICD-10-CM | POA: Diagnosis not present

## 2016-08-04 DIAGNOSIS — Z01818 Encounter for other preprocedural examination: Secondary | ICD-10-CM | POA: Diagnosis not present

## 2016-08-04 DIAGNOSIS — Z9889 Other specified postprocedural states: Secondary | ICD-10-CM | POA: Diagnosis not present

## 2016-08-04 DIAGNOSIS — H04411 Chronic dacryocystitis of right lacrimal passage: Secondary | ICD-10-CM | POA: Diagnosis not present

## 2016-08-04 DIAGNOSIS — H04551 Acquired stenosis of right nasolacrimal duct: Secondary | ICD-10-CM | POA: Diagnosis not present

## 2016-08-04 DIAGNOSIS — Z79899 Other long term (current) drug therapy: Secondary | ICD-10-CM | POA: Diagnosis not present

## 2016-08-04 DIAGNOSIS — H02419 Mechanical ptosis of unspecified eyelid: Secondary | ICD-10-CM | POA: Diagnosis not present

## 2016-08-04 DIAGNOSIS — I10 Essential (primary) hypertension: Secondary | ICD-10-CM | POA: Diagnosis not present

## 2016-08-04 DIAGNOSIS — H04221 Epiphora due to insufficient drainage, right lacrimal gland: Secondary | ICD-10-CM | POA: Diagnosis not present

## 2016-08-12 DIAGNOSIS — D485 Neoplasm of uncertain behavior of skin: Secondary | ICD-10-CM | POA: Diagnosis not present

## 2016-08-12 DIAGNOSIS — L820 Inflamed seborrheic keratosis: Secondary | ICD-10-CM | POA: Diagnosis not present

## 2016-08-12 DIAGNOSIS — L570 Actinic keratosis: Secondary | ICD-10-CM | POA: Diagnosis not present

## 2016-08-12 DIAGNOSIS — Z85828 Personal history of other malignant neoplasm of skin: Secondary | ICD-10-CM | POA: Diagnosis not present

## 2016-08-12 DIAGNOSIS — L821 Other seborrheic keratosis: Secondary | ICD-10-CM | POA: Diagnosis not present

## 2016-08-27 NOTE — Addendum Note (Signed)
Addendum  created 08/27/16 1259 by Roberts Gaudy, MD   Sign clinical note

## 2016-09-23 DIAGNOSIS — J321 Chronic frontal sinusitis: Secondary | ICD-10-CM | POA: Diagnosis not present

## 2016-09-23 DIAGNOSIS — J322 Chronic ethmoidal sinusitis: Secondary | ICD-10-CM | POA: Diagnosis not present

## 2016-09-23 DIAGNOSIS — J320 Chronic maxillary sinusitis: Secondary | ICD-10-CM | POA: Diagnosis not present

## 2016-10-19 DIAGNOSIS — Z23 Encounter for immunization: Secondary | ICD-10-CM | POA: Diagnosis not present

## 2016-10-19 DIAGNOSIS — I10 Essential (primary) hypertension: Secondary | ICD-10-CM | POA: Diagnosis not present

## 2016-10-19 DIAGNOSIS — Z125 Encounter for screening for malignant neoplasm of prostate: Secondary | ICD-10-CM | POA: Diagnosis not present

## 2016-10-19 DIAGNOSIS — I480 Paroxysmal atrial fibrillation: Secondary | ICD-10-CM | POA: Diagnosis not present

## 2016-10-19 DIAGNOSIS — E7800 Pure hypercholesterolemia, unspecified: Secondary | ICD-10-CM | POA: Diagnosis not present

## 2016-12-08 DIAGNOSIS — I480 Paroxysmal atrial fibrillation: Secondary | ICD-10-CM | POA: Diagnosis not present

## 2016-12-08 DIAGNOSIS — I472 Ventricular tachycardia: Secondary | ICD-10-CM | POA: Diagnosis not present

## 2016-12-08 DIAGNOSIS — I471 Supraventricular tachycardia: Secondary | ICD-10-CM | POA: Diagnosis not present

## 2016-12-08 DIAGNOSIS — I10 Essential (primary) hypertension: Secondary | ICD-10-CM | POA: Diagnosis not present

## 2017-03-24 DIAGNOSIS — J320 Chronic maxillary sinusitis: Secondary | ICD-10-CM | POA: Diagnosis not present

## 2017-03-24 DIAGNOSIS — J322 Chronic ethmoidal sinusitis: Secondary | ICD-10-CM | POA: Diagnosis not present

## 2017-03-24 DIAGNOSIS — J321 Chronic frontal sinusitis: Secondary | ICD-10-CM | POA: Diagnosis not present

## 2017-04-13 DIAGNOSIS — I471 Supraventricular tachycardia: Secondary | ICD-10-CM | POA: Diagnosis not present

## 2017-04-13 DIAGNOSIS — I10 Essential (primary) hypertension: Secondary | ICD-10-CM | POA: Diagnosis not present

## 2017-04-13 DIAGNOSIS — I480 Paroxysmal atrial fibrillation: Secondary | ICD-10-CM | POA: Diagnosis not present

## 2017-04-13 DIAGNOSIS — I472 Ventricular tachycardia: Secondary | ICD-10-CM | POA: Diagnosis not present

## 2017-04-22 DIAGNOSIS — I480 Paroxysmal atrial fibrillation: Secondary | ICD-10-CM | POA: Diagnosis not present

## 2017-04-22 DIAGNOSIS — Z125 Encounter for screening for malignant neoplasm of prostate: Secondary | ICD-10-CM | POA: Diagnosis not present

## 2017-04-22 DIAGNOSIS — I10 Essential (primary) hypertension: Secondary | ICD-10-CM | POA: Diagnosis not present

## 2017-04-27 DIAGNOSIS — I10 Essential (primary) hypertension: Secondary | ICD-10-CM | POA: Diagnosis not present

## 2017-04-27 DIAGNOSIS — I4891 Unspecified atrial fibrillation: Secondary | ICD-10-CM | POA: Diagnosis not present

## 2017-04-27 DIAGNOSIS — Z1212 Encounter for screening for malignant neoplasm of rectum: Secondary | ICD-10-CM | POA: Diagnosis not present

## 2017-04-27 DIAGNOSIS — Z7901 Long term (current) use of anticoagulants: Secondary | ICD-10-CM | POA: Diagnosis not present

## 2017-04-27 DIAGNOSIS — Z0000 Encounter for general adult medical examination without abnormal findings: Secondary | ICD-10-CM | POA: Diagnosis not present

## 2017-04-27 DIAGNOSIS — E7800 Pure hypercholesterolemia, unspecified: Secondary | ICD-10-CM | POA: Diagnosis not present

## 2017-08-04 IMAGING — CT CT MAXILLOFACIAL W/O CM
2 of 3 series · 15 of 40 positions shown, 18 images · non-contrast
Comparison: Limited sinus CT 2 weeks ago.

CLINICAL DATA: Chronic sinusitis for 1 year.

EXAM:
CT MAXILLOFACIAL WITHOUT CONTRAST
TECHNIQUE: Multidetector CT imaging of the maxillofacial structures was
performed. Multiplanar CT image reconstructions were also generated.
A small metallic BB was placed on the right temple in order to
reliably differentiate right from left.

[Series 601: coronal facial · coronal · 0.51mm/px · 3 of 124 slices shown]
[im 42/124  bone]
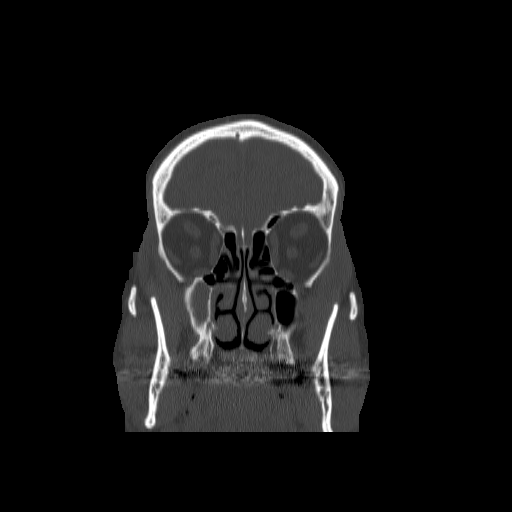
[im 55/124  bone]
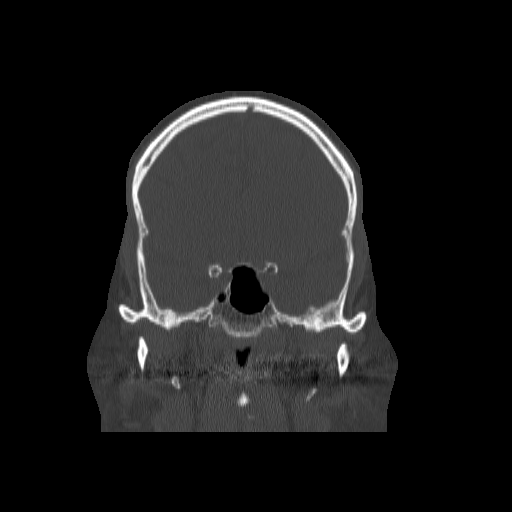
[im 69/124  bone]
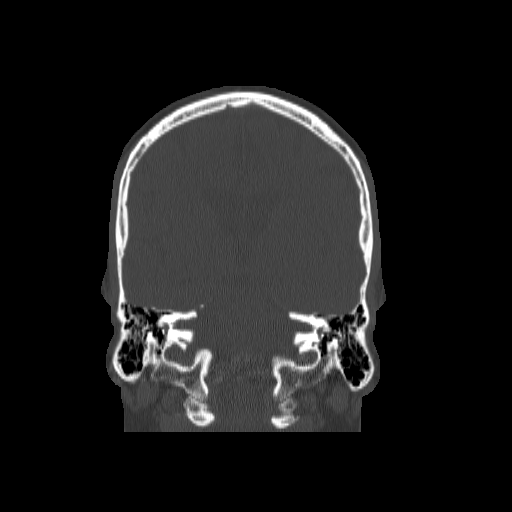

[Series 602: sagittal facial · sagittal · 0.51mm/px · 12 of 122 slices shown, 15 images]
[im 10/122  brain]
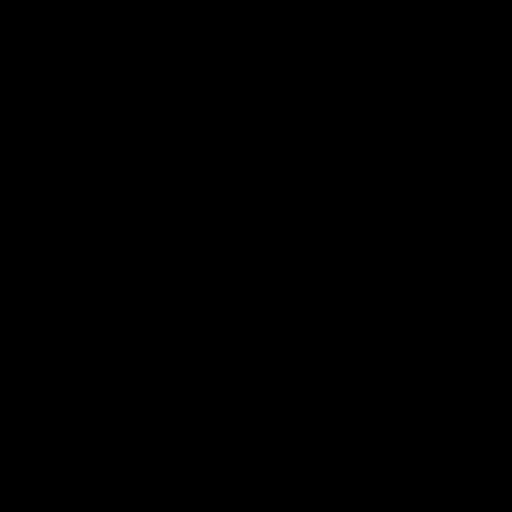
[im 10/122  bone]
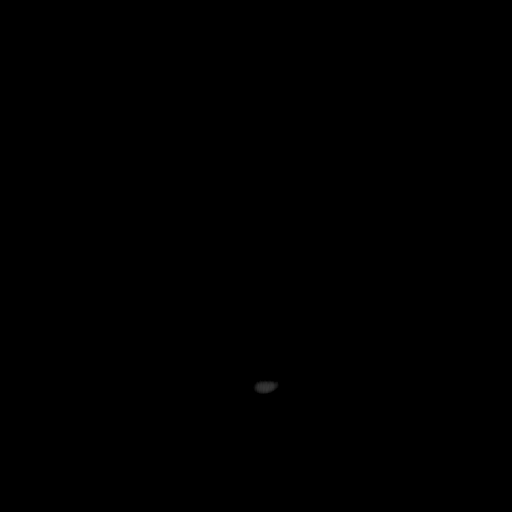
[im 19/122  bone]
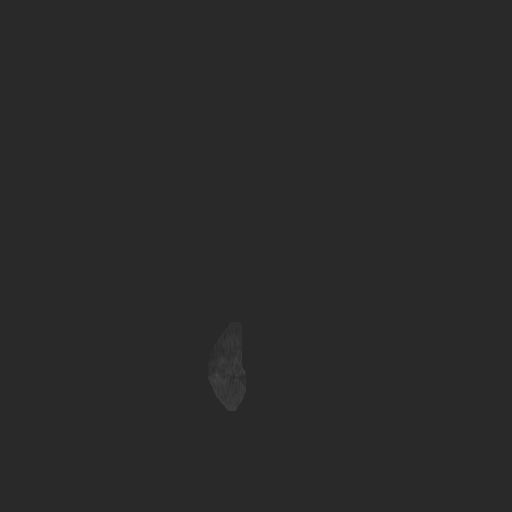
[im 28/122  bone]
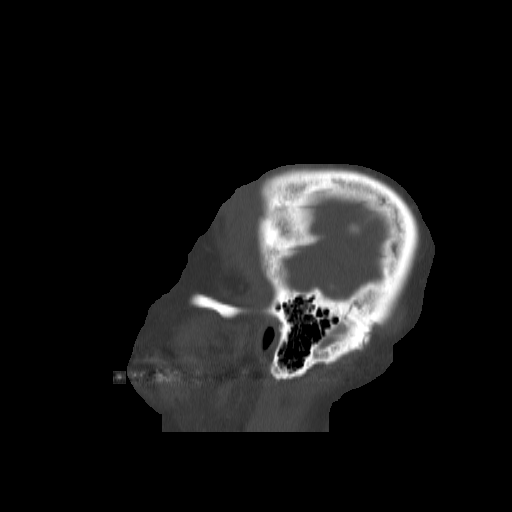
[im 38/122  bone]
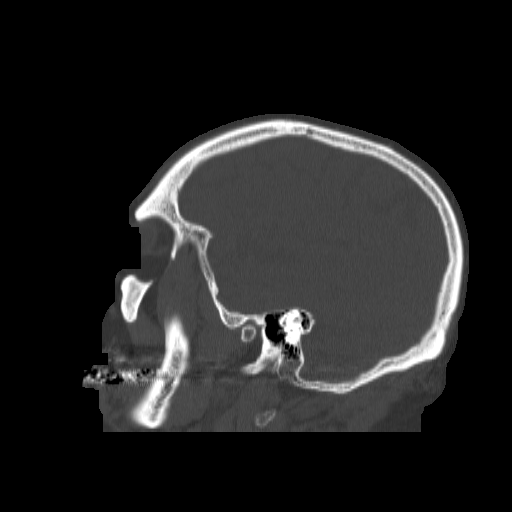
[im 47/122  brain]
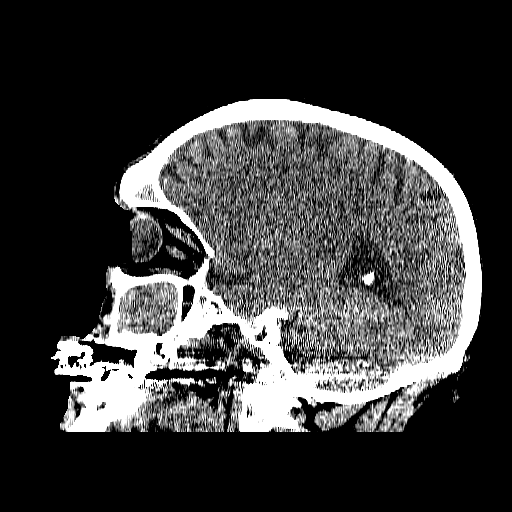
[im 47/122  bone]
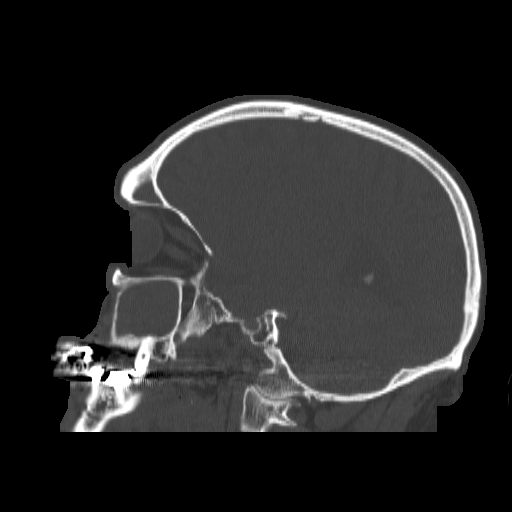
[im 56/122  bone]
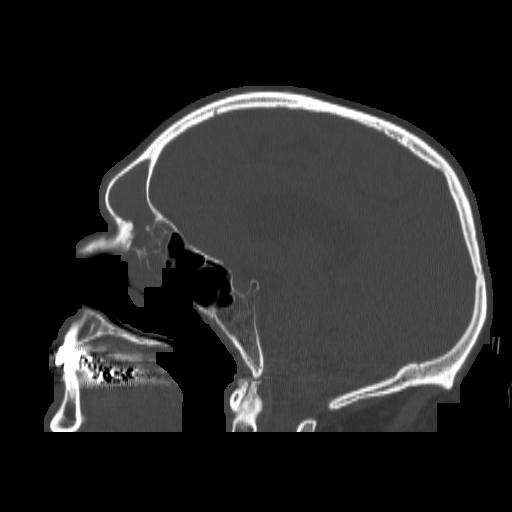
[im 66/122  bone]
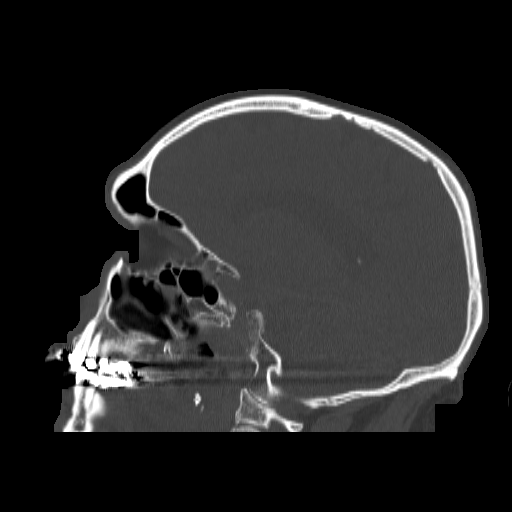
[im 75/122  bone]
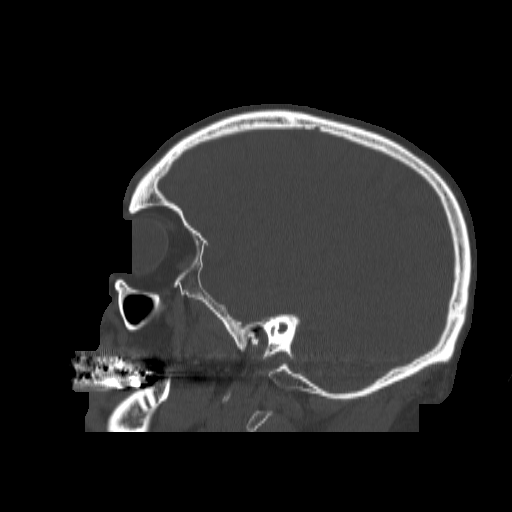
[im 84/122  brain]
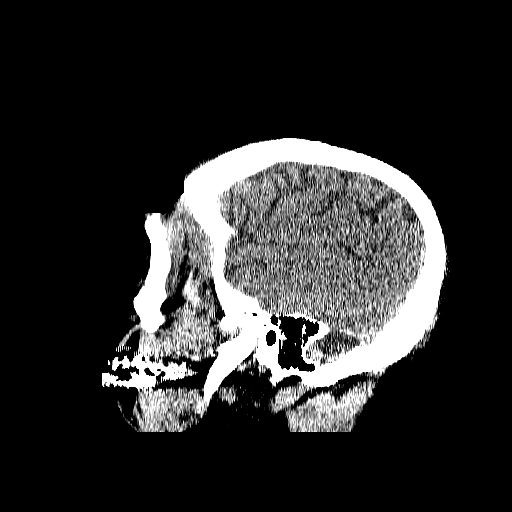
[im 84/122  bone]
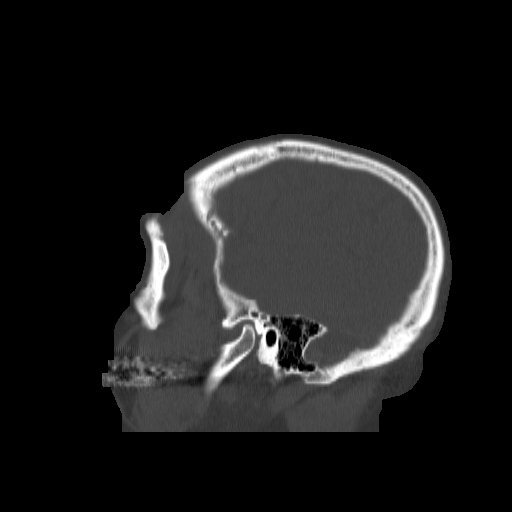
[im 94/122  bone]
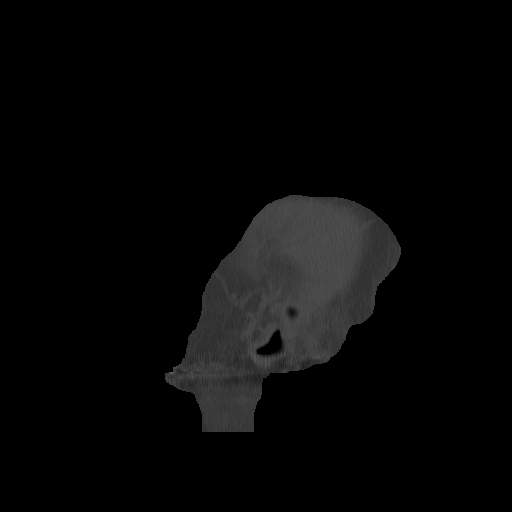
[im 103/122  bone]
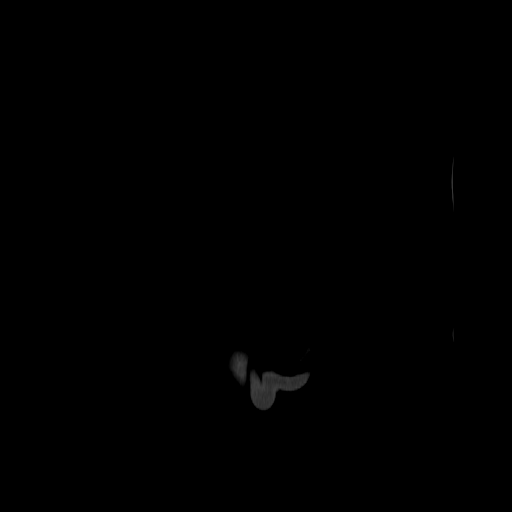
[im 112/122  bone]
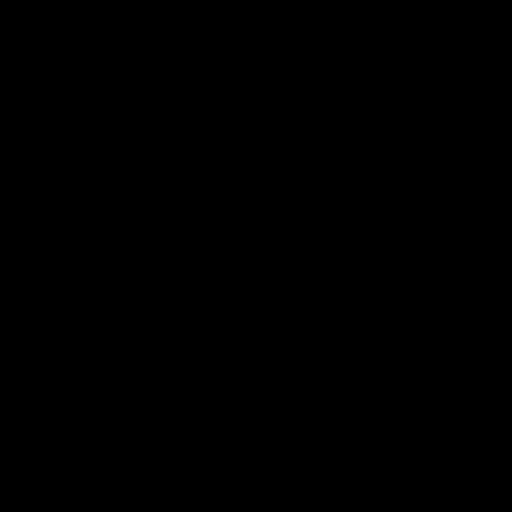

[15 of 40 positions shown; findings below may reference images not displayed]

FINDINGS: Osseous: No destructive lesion. Chronic remodeling of the medial
RIGHT maxillary sinus wall suggesting antrochoanal polyp.

Orbits: Negative.  BILATERAL cataract extraction.

Sinuses: Ostiomeatal pattern of obstruction involving the RIGHT
frontal sinus, RIGHT anterior ethmoid air cells, and RIGHT maxillary
sinus. Osseous thinning and slight erosion of the inferior aspect of
the RIGHT-sided anterior ethmoid air cells. Poor visualization of
the RIGHT uncinate process.

Soft tissues: BILATERAL inferior turbinate hypertrophy. No concha
bullosa. No nasal cavity masses.

Limited intracranial: Mild atrophy.  No masses.
IMPRESSION: Ostiomeatal pattern of obstruction, widening of the RIGHT maxillary
sinus ostium, complete opacification of the RIGHT frontal, RIGHT
anterior ethmoid, and RIGHT maxillary sinuses, suggesting antral
choanal polyp.

Early osseous erosion/remodeling of the adjacent bony septae,
particularly in the ethmoid region. Poor visualization of the RIGHT
uncinate process.

## 2017-08-23 DIAGNOSIS — L820 Inflamed seborrheic keratosis: Secondary | ICD-10-CM | POA: Diagnosis not present

## 2017-08-23 DIAGNOSIS — L570 Actinic keratosis: Secondary | ICD-10-CM | POA: Diagnosis not present

## 2017-08-23 DIAGNOSIS — Z85828 Personal history of other malignant neoplasm of skin: Secondary | ICD-10-CM | POA: Diagnosis not present

## 2017-08-23 DIAGNOSIS — L821 Other seborrheic keratosis: Secondary | ICD-10-CM | POA: Diagnosis not present

## 2017-08-23 DIAGNOSIS — D1801 Hemangioma of skin and subcutaneous tissue: Secondary | ICD-10-CM | POA: Diagnosis not present

## 2017-09-03 DIAGNOSIS — I471 Supraventricular tachycardia: Secondary | ICD-10-CM | POA: Diagnosis not present

## 2017-09-03 DIAGNOSIS — I472 Ventricular tachycardia: Secondary | ICD-10-CM | POA: Diagnosis not present

## 2017-09-03 DIAGNOSIS — I10 Essential (primary) hypertension: Secondary | ICD-10-CM | POA: Diagnosis not present

## 2017-09-03 DIAGNOSIS — I480 Paroxysmal atrial fibrillation: Secondary | ICD-10-CM | POA: Diagnosis not present

## 2017-10-26 DIAGNOSIS — E7800 Pure hypercholesterolemia, unspecified: Secondary | ICD-10-CM | POA: Diagnosis not present

## 2017-10-28 DIAGNOSIS — I10 Essential (primary) hypertension: Secondary | ICD-10-CM | POA: Diagnosis not present

## 2017-10-28 DIAGNOSIS — Z23 Encounter for immunization: Secondary | ICD-10-CM | POA: Diagnosis not present

## 2017-10-28 DIAGNOSIS — E7800 Pure hypercholesterolemia, unspecified: Secondary | ICD-10-CM | POA: Diagnosis not present

## 2017-10-28 DIAGNOSIS — I4891 Unspecified atrial fibrillation: Secondary | ICD-10-CM | POA: Diagnosis not present

## 2017-10-28 DIAGNOSIS — Z7901 Long term (current) use of anticoagulants: Secondary | ICD-10-CM | POA: Diagnosis not present

## 2018-01-03 DIAGNOSIS — I472 Ventricular tachycardia: Secondary | ICD-10-CM | POA: Diagnosis not present

## 2018-01-03 DIAGNOSIS — I10 Essential (primary) hypertension: Secondary | ICD-10-CM | POA: Diagnosis not present

## 2018-01-03 DIAGNOSIS — I480 Paroxysmal atrial fibrillation: Secondary | ICD-10-CM | POA: Diagnosis not present

## 2018-01-03 DIAGNOSIS — I471 Supraventricular tachycardia: Secondary | ICD-10-CM | POA: Diagnosis not present

## 2018-03-23 DIAGNOSIS — J322 Chronic ethmoidal sinusitis: Secondary | ICD-10-CM | POA: Diagnosis not present

## 2018-03-23 DIAGNOSIS — J321 Chronic frontal sinusitis: Secondary | ICD-10-CM | POA: Diagnosis not present

## 2018-03-23 DIAGNOSIS — J320 Chronic maxillary sinusitis: Secondary | ICD-10-CM | POA: Diagnosis not present

## 2018-04-05 ENCOUNTER — Other Ambulatory Visit: Payer: Self-pay | Admitting: Cardiology

## 2018-04-30 NOTE — Progress Notes (Signed)
Subjective:  Primary Physician:  Patient, No Pcp Per  Patient ID: Joshua Shields, male    DOB: [birth year], 73 y.o.   MRN: [id number]  This visit type was conducted due to national recommendations for restrictions regarding the COVID-19 Pandemic (e.g. social distancing).  This format is felt to be most appropriate for this patient at this time.  All issues noted in this document were discussed and addressed.  No physical exam was performed (except for noted visual exam findings with Telehealth visits - very limited).  The patient has consented to conduct a Telehealth visit and understands insurance will be billed.   I connected with patient, on 05/03/18  by a video enabled telemedicine application and verified that I am speaking with the correct person using two identifiers.     I discussed the limitations of evaluation and management by telemedicine and the availability of in person appointments. The patient expressed understanding and agreed to proceed.   I have discussed with patient regarding the safety during COVID Pandemic and steps and precautions including social distancing with the patient.    Chief Complaint  Patient presents with  . Atrial Fibrillation    12mo  . Follow-up    HPI: Joshua Shields  is a 73 y.o. male. who presents for  paroxysmal atrial fibrillation and PSVT.  He had vasovagal syncope from intubation/induction of anesthesia on 11/18/2015, when he went for sinus surgery at short stay surgery center. During anesthesia, his heart rate became very slow. Apparently, for short time, they were not able to feel his pulse and he was given brief CPR and epinephrine injection x1 with rapid improvement in pulse and blood pressure. Surgery was canceled. Subsequently, he was sent to ER and was admitted to the hospital. There were no further problems. Patient eventually had sinus surgery done in April 2018 and there were no cardiac problems.  Patient has been feeling fair. He  denies feeling any palpitation, rapid heart beat. No c/o dizziness, near-syncope or syncope. He has been checking his pulse at home and it has been regular most of the times. Sometimes, he finds it irregular but there are no associated symptoms.  No complaints of chest heaviness, tightness or pressure. No shortness of breath, orthopnea or PND. No history of swelling on the legs or claudication.  Patient has hypertension and hypercholesterolemia. No history of diabetes. He does not smoke. He drinks 1 glass of wine daily. He walks his dog for 1-2 miles every day.  No history of thyroid problems. No history of TIA or CVA.  No history of bleeding from any site and no history of excessive bruising.    Past Medical History:  Diagnosis Date  . A-fib ([place]) 07/29/2015  . Arthritis    hand  . Complication of anesthesia    cardiac arrest  . Dysrhythmia    a-fib  . Headache   . Hyperlipidemia   . Hypertension     Past Surgical History:  Procedure Laterality Date  . COLONOSCOPY    . ETHMOIDECTOMY Right 04/17/2016   Procedure: RIGHT TOTAL ETHMOIDECTOMY;  Surgeon: Leta Baptist, MD;  Location: [facility];  Service: ENT;  Laterality: Right;  . FRONTAL SINUS EXPLORATION N/A 04/17/2016   Procedure: FRONTAL RECESS EXPLORATION;  Surgeon: Leta Baptist, MD;  Location: [facility];  Service: ENT;  Laterality: N/A;  . MAXILLARY ANTROSTOMY Right 04/17/2016   Procedure: RIGHT MAXILLARY ANTROSTOMY WITH TISSUE REMOVAL;  Surgeon: Leta Baptist, MD;  Location: [facility];  Service: ENT;  Laterality: Right;  . NO PAST SURGERIES    . SINUS ENDO WITH FUSION N/A 04/17/2016   Procedure: SINUS ENDO WITH FUSION;  Surgeon: Leta Baptist, MD;  Location: [facility];  Service: ENT;  Laterality: N/A;    Social History   Socioeconomic History  . Marital status: Married    Spouse name: Not on file  . Number of children: 2  . Years of education: Not on file  . Highest education level: Not on file  Occupational History  . Not on file  Social Needs  .  Financial resource strain: Not on file  . Food insecurity:    Worry: Not on file    Inability: Not on file  . Transportation needs:    Medical: Not on file    Non-medical: Not on file  Tobacco Use  . Smoking status: Never Smoker  . Smokeless tobacco: Never Used  Substance and Sexual Activity  . Alcohol use: Yes    Comment: social  . Drug use: No  . Sexual activity: Not on file  Lifestyle  . Physical activity:    Days per week: Not on file    Minutes per session: Not on file  . Stress: Not on file  Relationships  . Social connections:    Talks on phone: Not on file    Gets together: Not on file    Attends religious service: Not on file    Active member of club or organization: Not on file    Attends meetings of clubs or organizations: Not on file    Relationship status: Not on file  . Intimate partner violence:    Fear of current or ex partner: Not on file    Emotionally abused: Not on file    Physically abused: Not on file    Forced sexual activity: Not on file  Other Topics Concern  . Not on file  Social History Narrative  . Not on file    Current Outpatient Medications on File Prior to Visit  Medication Sig Dispense Refill  . acetaminophen (TYLENOL) 500 MG tablet Take 1,000 mg by mouth every 6 (six) hours as needed for headache.    . lisinopril (PRINIVIL,ZESTRIL) 20 MG tablet Take 20 mg by mouth daily.    . metoprolol tartrate (LOPRESSOR) 25 MG tablet TAKE 1 TABLET BY MOUTH DAILY 90 tablet 0  . simvastatin (ZOCOR) 20 MG tablet Take 20 mg by mouth daily at 6 PM.     . XARELTO 20 MG TABS tablet Take 20 mg by mouth daily.     No current facility-administered medications on file prior to visit.     Review of Systems  Constitutional: Negative for fever.  HENT: Negative for nosebleeds.   Eyes: Negative for blurred vision.  Respiratory: Negative for cough.   Gastrointestinal: Negative for abdominal pain, nausea and vomiting.  Genitourinary: Negative for dysuria.   Musculoskeletal: Negative for myalgias.  Skin: Negative for itching and rash.  Neurological: Negative for dizziness, seizures and loss of consciousness.  Psychiatric/Behavioral: The patient is not nervous/anxious.        Objective:  Height 6' (1.829 m), weight 202 lb (91.6 kg). Body mass index is 27.4 kg/m.  Physical Exam: Patient is alert and oriented.  He appeared very comfortable talking to me during the visit.  No further physical examination was possible as it was a telemedicine visit.  CARDIAC STUDIES:  Event monitor-12/09/2015-01/07/2016-sinus rhythm, occasional PACs, PVCs. Few runs of Paroxysmal atrial fibrillation with rapid ventricular response,  maximum heart rate with A. fib was 160/m. Occasional sinus bradycardia, minimum heart rate was 48/m. Patient had reported rapid, fast heartbeat during the runs of atrial fibrillation. He reported "flutter or skipping" during PVCs. Echo- 01/02/2015 1. Left ventricle cavity is normal in size. Normal global wall motion. Doppler evidence of grade I (impaired) diastolic dysfunction. Calculated EF 65%. 2. Mild mitral regurgitation. 3. Mild tricuspid regurgitation. No evidence of pulmonary hypertension. Lexiscan myoview stress test 12/12/2015: 1. The resting electrocardiogram demonstrated normal sinus rhythm, RBBB and no resting arrhythmias. Stress EKG is non-diagnostic for ischemia as it a pharmacologic stress using Lexiscan. There were PACs and one 3 beat atrial ectopic beats during recovery phase with Lexiscan infusion. 2. Myocardial perfusion imaging is normal. Overall left ventricular systolic function was normal without regional wall motion abnormalities. The left ventricular ejection fraction was 77%.  Assessment & Recommendations:   1. PAF (paroxysmal atrial fibrillation) ([place])  2. Nonsustained ventricular tachycardia ([place])  3. PSVT (paroxysmal supraventricular tachycardia) ([place])  4. Essential hypertension  5.  Hypercholesterolemia   Laboratory Exam: 10/26/2017-glucose-83, BUN-21, creatinine-1.1, sodium-140, potassium-4.7, normal liver enzymes TSH-1.65 WBC-6.6, hemoglobin-16.1, hematocrit-46.4, platelets-162  Lipid Panel  10/26/2017-cholesterol-160, triglycerides-107, HDL-44, LDL-95.  Normal liver enzymes.  Recommendation:  Patient is asymptomatic, he does not feel any palpitation, rapid or irregular heartbeat. I have advised him to continue metoprolol once a day.  Continue Xarelto. Side effects of xarelto therapy, especially bleeding complications Like GI bleed, hematuria, hemorrhagic stroke etc. were again explained. Patient verbalized understanding. He was advised to avoid aspirin or other NSAIDs.  Blood pressure was controlled during the last visit. Patient has not checked his blood pressure since then. I have advised him to continue present medications for blood pressure and cholesterol. He will continue to have all the blood tests at Dr. Julianne Rice office.  Primary prevention was again discussed with him. He was advised to follow low-salt, low-cholesterol diet. I have encouraged him to continue regular walking.  Return for follow-up after 4 months but call us earlier if there are any cardiac problems.  Despina Hick, MD, [employer] 05/03/2018, 4:06 PM [facility] [place] Pager: [phone] Office: [phone] If no answer Cell [phone]

## 2018-05-03 ENCOUNTER — Other Ambulatory Visit: Payer: Self-pay

## 2018-05-03 ENCOUNTER — Ambulatory Visit (INDEPENDENT_AMBULATORY_CARE_PROVIDER_SITE_OTHER): Payer: Medicare Other | Admitting: Cardiology

## 2018-05-03 ENCOUNTER — Encounter: Payer: Self-pay | Admitting: Cardiology

## 2018-05-03 VITALS — Ht 72.0 in | Wt 202.0 lb

## 2018-05-03 DIAGNOSIS — I480 Paroxysmal atrial fibrillation: Secondary | ICD-10-CM

## 2018-05-03 DIAGNOSIS — I10 Essential (primary) hypertension: Secondary | ICD-10-CM

## 2018-05-03 DIAGNOSIS — I471 Supraventricular tachycardia: Secondary | ICD-10-CM | POA: Diagnosis not present

## 2018-05-03 DIAGNOSIS — I4729 Other ventricular tachycardia: Secondary | ICD-10-CM

## 2018-05-03 DIAGNOSIS — E7800 Pure hypercholesterolemia, unspecified: Secondary | ICD-10-CM

## 2018-05-03 DIAGNOSIS — I472 Ventricular tachycardia: Secondary | ICD-10-CM | POA: Diagnosis not present

## 2018-05-20 DIAGNOSIS — Z125 Encounter for screening for malignant neoplasm of prostate: Secondary | ICD-10-CM | POA: Diagnosis not present

## 2018-05-20 DIAGNOSIS — E7800 Pure hypercholesterolemia, unspecified: Secondary | ICD-10-CM | POA: Diagnosis not present

## 2018-05-20 DIAGNOSIS — I10 Essential (primary) hypertension: Secondary | ICD-10-CM | POA: Diagnosis not present

## 2018-05-26 DIAGNOSIS — L820 Inflamed seborrheic keratosis: Secondary | ICD-10-CM | POA: Diagnosis not present

## 2018-05-26 DIAGNOSIS — I4891 Unspecified atrial fibrillation: Secondary | ICD-10-CM | POA: Diagnosis not present

## 2018-05-26 DIAGNOSIS — I10 Essential (primary) hypertension: Secondary | ICD-10-CM | POA: Diagnosis not present

## 2018-05-26 DIAGNOSIS — Z125 Encounter for screening for malignant neoplasm of prostate: Secondary | ICD-10-CM | POA: Diagnosis not present

## 2018-05-26 DIAGNOSIS — Z0000 Encounter for general adult medical examination without abnormal findings: Secondary | ICD-10-CM | POA: Diagnosis not present

## 2018-05-26 DIAGNOSIS — E7800 Pure hypercholesterolemia, unspecified: Secondary | ICD-10-CM | POA: Diagnosis not present

## 2018-05-27 ENCOUNTER — Other Ambulatory Visit: Payer: Self-pay

## 2018-09-06 ENCOUNTER — Ambulatory Visit: Payer: Medicare Other | Admitting: Cardiology

## 2018-09-07 ENCOUNTER — Encounter: Payer: Self-pay | Admitting: Cardiology

## 2018-09-07 ENCOUNTER — Other Ambulatory Visit: Payer: Self-pay

## 2018-09-07 ENCOUNTER — Ambulatory Visit (INDEPENDENT_AMBULATORY_CARE_PROVIDER_SITE_OTHER): Payer: Medicare Other | Admitting: Cardiology

## 2018-09-07 VITALS — BP 123/79 | HR 71 | Ht 72.0 in | Wt 208.0 lb

## 2018-09-07 DIAGNOSIS — I480 Paroxysmal atrial fibrillation: Secondary | ICD-10-CM

## 2018-09-07 DIAGNOSIS — I4891 Unspecified atrial fibrillation: Secondary | ICD-10-CM

## 2018-09-07 DIAGNOSIS — I471 Supraventricular tachycardia: Secondary | ICD-10-CM

## 2018-09-07 DIAGNOSIS — E7800 Pure hypercholesterolemia, unspecified: Secondary | ICD-10-CM | POA: Diagnosis not present

## 2018-09-07 DIAGNOSIS — I10 Essential (primary) hypertension: Secondary | ICD-10-CM | POA: Diagnosis not present

## 2018-09-07 NOTE — Progress Notes (Signed)
Primary Physician:  Jani Gravel, MD   Patient ID: Joshua Shields, male    DOB: 1945-11-23, 73 y.o.   MRN: [id number]  Subjective:    Chief Complaint  Patient presents with  . Atrial Fibrillation  . Hypertension  . Follow-up    HPI: Joshua Shields  is a 73 y.o. male  with hypertension, hypercholesterolemia, paroxysmal A fib and PSVT.   Patient reports that he is doing well. He denies feeling any palpitation, rapid heart beat. No c/o dizziness, near-syncope or syncope. No complaints of chest heaviness, tightness or pressure. No shortness of breath, orthopnea or PND. No history of swelling on the legs or claudication  He had vasovagal syncope from intubation/induction of anesthesia on 11/18/2015, when he went for sinus surgery at short stay surgery center. During anesthesia, his heart rate became very slow. Apparently, for short time, they were not able to feel his pulse and he was given brief CPR and epinephrine injection x1 with rapid improvement in pulse and blood pressure. Surgery was canceled. Subsequently, he was sent to ER and was admitted to the hospital. There were no further problems. Patient eventually had sinus surgery done in April 2018 and there were no cardiac problems.  He does not smoke. He drinks 1 glass of wine daily.  Past Medical History:  Diagnosis Date  . A-fib ([place]) 07/29/2015  . Arthritis    hand  . Complication of anesthesia    cardiac arrest  . Dysrhythmia    a-fib  . Headache   . Hyperlipidemia   . Hypertension     Past Surgical History:  Procedure Laterality Date  . COLONOSCOPY    . ETHMOIDECTOMY Right 04/17/2016   Procedure: RIGHT TOTAL ETHMOIDECTOMY;  Surgeon: Leta Baptist, MD;  Location: [facility];  Service: ENT;  Laterality: Right;  . FRONTAL SINUS EXPLORATION N/A 04/17/2016   Procedure: FRONTAL RECESS EXPLORATION;  Surgeon: Leta Baptist, MD;  Location: [facility];  Service: ENT;  Laterality: N/A;  . MAXILLARY ANTROSTOMY Right 04/17/2016   Procedure: RIGHT  MAXILLARY ANTROSTOMY WITH TISSUE REMOVAL;  Surgeon: Leta Baptist, MD;  [facility];  Service: ENT;  Laterality: Right;  . NO PAST SURGERIES    . SINUS ENDO WITH FUSION N/A 04/17/2016   Procedure: SINUS ENDO WITH FUSION;  Surgeon: Leta Baptist, MD;  [facility];  Service: ENT;  Laterality: N/A;    Social History   Socioeconomic History  . Marital status: Married    Spouse name: Not on file  . Number of children: 2  . Years of education: Not on file  . Highest education level: Not on file  Occupational History  . Not on file  Social Needs  . Financial resource strain: Not on file  . Food insecurity    Worry: Not on file    Inability: Not on file  . Transportation needs    Medical: Not on file    Non-medical: Not on file  Tobacco Use  . Smoking status: Never Smoker  . Smokeless tobacco: Never Used  Substance and Sexual Activity  . Alcohol use: Yes    Comment: social  . Drug use: No  . Sexual activity: Not on file  Lifestyle  . Physical activity    Days per week: Not on file    Minutes per session: Not on file  . Stress: Not on file  Relationships  . Social Herbalist on phone: Not on file    Gets together: Not  on file    Attends religious service: Not on file    Active member of club or organization: Not on file    Attends meetings of clubs or organizations: Not on file    Relationship status: Not on file  . Intimate partner violence    Fear of current or ex partner: Not on file    Emotionally abused: Not on file    Physically abused: Not on file    Forced sexual activity: Not on file  Other Topics Concern  . Not on file  Social History Narrative  . Not on file    Review of Systems  Constitution: Negative for decreased appetite, malaise/fatigue, weight gain and weight loss.  Eyes: Negative for visual disturbance.  Cardiovascular: Negative for chest pain, claudication, dyspnea on exertion, leg swelling, orthopnea, palpitations and syncope.  Respiratory:  Negative for hemoptysis and wheezing.   Endocrine: Negative for cold intolerance and heat intolerance.  Hematologic/Lymphatic: Does not bruise/bleed easily.  Skin: Negative for nail changes.  Musculoskeletal: Negative for muscle weakness and myalgias.  Gastrointestinal: Negative for abdominal pain, change in bowel habit, nausea and vomiting.  Neurological: Negative for difficulty with concentration, dizziness, focal weakness and headaches.  Psychiatric/Behavioral: Negative for altered mental status and suicidal ideas.  All other systems reviewed and are negative.     Objective:  Blood pressure 123/79, pulse 71, height 6' (1.829 m), weight 208 lb (94.3 kg), SpO2 96 %. Body mass index is 28.21 kg/m.    Physical Exam  Constitutional: He is oriented to person, place, and time. Vital signs are normal. He appears well-developed and well-nourished.  HENT:  Head: Normocephalic and atraumatic.  Neck: Normal range of motion.  Cardiovascular: Normal rate, regular rhythm and intact distal pulses.  Murmur heard.  Midsystolic murmur is present with a grade of 2/6 at the lower left sternal border and apex. Pulses:      Dorsalis pedis pulses are 2+ on the right side and 2+ on the left side.       Posterior tibial pulses are 2+ on the right side and 2+ on the left side.  Pulmonary/Chest: Effort normal and breath sounds normal. No accessory muscle usage. No respiratory distress.  Abdominal: Soft. Bowel sounds are normal.  Musculoskeletal: Normal range of motion.  Neurological: He is alert and oriented to person, place, and time.  Skin: Skin is warm and dry.  Vitals reviewed.  Radiology: No results found.  Laboratory examination:    04/22/2017-cholesterol-166, HDL-42, LDL-91, triglycerides-166. Normal liver enzymes.  CMP Latest Ref Rng & Units 04/13/2016 11/19/2015 11/18/2015  Glucose 65 - 99 mg/dL 96 144(H) 111(H)  BUN 6 - 20 mg/dL 19 18 24(H)  Creatinine 0.61 - 1.24 mg/dL 1.05 1.11 1.20   Sodium 135 - 145 mmol/L 137 136 141  Potassium 3.5 - 5.1 mmol/L 4.0 4.5 3.8  Chloride 101 - 111 mmol/L 107 106 102  CO2 22 - 32 mmol/L 22 23 -  Calcium 8.9 - 10.3 mg/dL 9.0 8.8(L) -  Total Protein 6.5 - 8.1 g/dL - - -  Total Bilirubin 0.3 - 1.2 mg/dL - - -  Alkaline Phos 38 - 126 U/L - - -  AST 15 - 41 U/L - - -  ALT 17 - 63 U/L - - -   CBC Latest Ref Rng & Units 04/13/2016 11/19/2015 11/18/2015  WBC 4.0 - 10.5 K/uL 6.2 7.2 -  Hemoglobin 13.0 - 17.0 g/dL 16.1 13.8 15.3  Hematocrit 39.0 - 52.0 % 46.3 41.3 45.0  Platelets 150 - 400 K/uL 159 134(L) -   Lipid Panel     Component Value Date/Time   CHOL 139 11/19/2015 0809   TRIG 230 (H) 11/19/2015 0809   HDL 37 (L) 11/19/2015 0809   CHOLHDL 3.8 11/19/2015 0809   VLDL 46 (H) 11/19/2015 0809   LDLCALC 56 11/19/2015 0809   HEMOGLOBIN A1C No results found for: HGBA1C, MPG TSH No results for input(s): TSH in the last 8760 hours.  PRN Meds:. There are no discontinued medications. Current Meds  Medication Sig  . acetaminophen (TYLENOL) 500 MG tablet Take 1,000 mg by mouth every 6 (six) hours as needed for headache.  . lisinopril (PRINIVIL,ZESTRIL) 20 MG tablet Take 20 mg by mouth daily.  . simvastatin (ZOCOR) 20 MG tablet Take 20 mg by mouth daily at 6 PM.   . XARELTO 20 MG TABS tablet Take 20 mg by mouth daily.  . [DISCONTINUED] metoprolol tartrate (LOPRESSOR) 25 MG tablet TAKE 1 TABLET BY MOUTH DAILY    Cardiac Studies:   Event monitor-12/09/2015-01/07/2016-sinus rhythm, occasional PACs, PVCs. Few runs of Paroxysmal atrial fibrillation with rapid ventricular response, maximum heart rate with A. fib was 160/m. Occasional sinus bradycardia, minimum heart rate was 48/m. Patient had reported rapid, fast heartbeat during the runs of atrial fibrillation. He reported "flutter or skipping" during PVCs.  Echo- 01/02/2015 1. Left ventricle cavity is normal in size. Normal global wall motion. Doppler evidence of grade I (impaired) diastolic  dysfunction. Calculated EF 65%. 2. Mild mitral regurgitation. 3. Mild tricuspid regurgitation. No evidence of pulmonary Hypertension.  Lexiscan myoview stress test 12/12/2015: 1. The resting electrocardiogram demonstrated normal sinus rhythm, RBBB and no resting arrhythmias. Stress EKG is non-diagnostic for ischemia as it a pharmacologic stress using Lexiscan. There were PACs and one 3 beat atrial ectopic beats during recovery phase with Lexiscan infusion. 2. Myocardial perfusion imaging is normal. Overall left ventricular systolic function was normal without regional wall motion abnormalities. The left ventricular ejection fraction was 77%.  Assessment:   Atrial fibrillation with controlled ventricular response (HCC) - Plan: EKG 12-Lead  PSVT (paroxysmal supraventricular tachycardia) ([place]) - Plan: EKG 12-Lead  Essential hypertension  Hypercholesteremia  EKG 09/07/2018: Atrial fibrillation at 82 bpm, right axis deviation, right bundle branch block, no evidence of ischemia.   CHA2DS2-VASCScore: Risk Score 2,  Yearly risk of stroke 2.2%. Recommendation: Anticoagulation    Recommendations:   Patient is here for 69-month follow-up for atrial fibrillation.  Previously he was found to have paroxysmal atrial fibrillation; however, on his last 2 visits he has been in atrial fibrillation and feel that this is likely permanent.  He is asymptomatic in regards to his A. fib and he remains rate controlled.  Given his lack of symptoms, will continue with rate control therapy.  He is on anticoagulation and tolerates this well.  No bleeding diathesis.  He has not had any known recurrence of SVT.  He does have occasional times of PVCs that are not bothersome to him.  I have reassured him.  Blood pressure is well controlled.  He states he has recently had labs PCP and states was told all of his labs were within normal range.  I will request these for our records.  Overall, patient is doing well.  We will see  him back in 6 months or sooner if problems.  Miquel Dunn, MSN, APRN, FNP-C [facility]. [place] Office: [phone] Fax: [phone]

## 2018-09-09 ENCOUNTER — Other Ambulatory Visit: Payer: Self-pay | Admitting: Cardiology

## 2018-10-06 DIAGNOSIS — L570 Actinic keratosis: Secondary | ICD-10-CM | POA: Diagnosis not present

## 2018-10-06 DIAGNOSIS — L720 Epidermal cyst: Secondary | ICD-10-CM | POA: Diagnosis not present

## 2018-10-06 DIAGNOSIS — L820 Inflamed seborrheic keratosis: Secondary | ICD-10-CM | POA: Diagnosis not present

## 2018-10-06 DIAGNOSIS — L918 Other hypertrophic disorders of the skin: Secondary | ICD-10-CM | POA: Diagnosis not present

## 2018-10-06 DIAGNOSIS — Z85828 Personal history of other malignant neoplasm of skin: Secondary | ICD-10-CM | POA: Diagnosis not present

## 2018-10-06 DIAGNOSIS — L814 Other melanin hyperpigmentation: Secondary | ICD-10-CM | POA: Diagnosis not present

## 2018-10-06 DIAGNOSIS — D1801 Hemangioma of skin and subcutaneous tissue: Secondary | ICD-10-CM | POA: Diagnosis not present

## 2018-10-06 DIAGNOSIS — L821 Other seborrheic keratosis: Secondary | ICD-10-CM | POA: Diagnosis not present

## 2018-10-26 DIAGNOSIS — Z23 Encounter for immunization: Secondary | ICD-10-CM | POA: Diagnosis not present

## 2018-11-18 DIAGNOSIS — E7800 Pure hypercholesterolemia, unspecified: Secondary | ICD-10-CM | POA: Diagnosis not present

## 2018-11-18 DIAGNOSIS — Z125 Encounter for screening for malignant neoplasm of prostate: Secondary | ICD-10-CM | POA: Diagnosis not present

## 2018-11-18 DIAGNOSIS — E785 Hyperlipidemia, unspecified: Secondary | ICD-10-CM | POA: Diagnosis not present

## 2018-11-18 DIAGNOSIS — I10 Essential (primary) hypertension: Secondary | ICD-10-CM | POA: Diagnosis not present

## 2018-11-23 DIAGNOSIS — I4891 Unspecified atrial fibrillation: Secondary | ICD-10-CM | POA: Diagnosis not present

## 2018-11-23 DIAGNOSIS — Z7901 Long term (current) use of anticoagulants: Secondary | ICD-10-CM | POA: Diagnosis not present

## 2018-11-23 DIAGNOSIS — I10 Essential (primary) hypertension: Secondary | ICD-10-CM | POA: Diagnosis not present

## 2018-11-23 DIAGNOSIS — E7800 Pure hypercholesterolemia, unspecified: Secondary | ICD-10-CM | POA: Diagnosis not present

## 2018-12-09 ENCOUNTER — Other Ambulatory Visit: Payer: Self-pay | Admitting: Cardiology

## 2019-01-24 ENCOUNTER — Ambulatory Visit: Payer: Medicare Other | Attending: Internal Medicine

## 2019-01-24 DIAGNOSIS — Z23 Encounter for immunization: Secondary | ICD-10-CM | POA: Diagnosis not present

## 2019-01-24 NOTE — Progress Notes (Signed)
   Covid-19 Vaccination Clinic  Name:  Joshua Shields    MRN: [id number] DOB: 1945-09-11  01/24/2019  Joshua Shields was observed post Covid-19 immunization for 30 minutes based on pre-vaccination screening without incidence. He was provided with Vaccine Information Sheet and instruction to access the V-Safe system.   Joshua Shields was instructed to call 911 with any severe reactions post vaccine: Marland Kitchen Difficulty breathing  . Swelling of your face and throat  . A fast heartbeat  . A bad rash all over your body  . Dizziness and weakness    Immunizations Administered    Name Date Dose VIS Date Route   Pfizer COVID-19 Vaccine 01/24/2019  2:49 PM 0.3 mL 12/16/2018 Intramuscular   Manufacturer: [place]   Lot: [id number]   [place]: [id number]

## 2019-02-13 ENCOUNTER — Ambulatory Visit: Payer: Medicare Other | Attending: Internal Medicine

## 2019-02-13 DIAGNOSIS — Z23 Encounter for immunization: Secondary | ICD-10-CM

## 2019-02-13 NOTE — Progress Notes (Signed)
   Covid-19 Vaccination Clinic  Name:  Joshua Shields    MRN: [id number] DOB: [birth year]  02/13/2019  Mr. Joshua Shields was observed post Covid-19 immunization for 15 minutes without incidence. He was provided with Vaccine Information Sheet and instruction to access the V-Safe system.   Mr. Joshua Shields was instructed to call 911 with any severe reactions post vaccine: Marland Kitchen Difficulty breathing  . Swelling of your face and throat  . A fast heartbeat  . A bad rash all over your body  . Dizziness and weakness    Immunizations Administered    Name Date Dose VIS Date Route   Pfizer COVID-19 Vaccine 02/13/2019  4:11 PM 0.3 mL 12/16/2018 Intramuscular   Manufacturer: [place]   Lot: [id number]   [place]: [id number]

## 2019-03-09 ENCOUNTER — Encounter: Payer: Self-pay | Admitting: Cardiology

## 2019-03-09 ENCOUNTER — Other Ambulatory Visit: Payer: Self-pay

## 2019-03-09 ENCOUNTER — Other Ambulatory Visit: Payer: Self-pay | Admitting: Cardiology

## 2019-03-09 ENCOUNTER — Ambulatory Visit: Payer: Medicare Other | Admitting: Cardiology

## 2019-03-09 VITALS — BP 107/76 | HR 69 | Temp 96.5°F | Resp 16 | Ht 72.0 in | Wt 214.1 lb

## 2019-03-09 DIAGNOSIS — E7800 Pure hypercholesterolemia, unspecified: Secondary | ICD-10-CM | POA: Diagnosis not present

## 2019-03-09 DIAGNOSIS — I4821 Permanent atrial fibrillation: Secondary | ICD-10-CM | POA: Diagnosis not present

## 2019-03-09 DIAGNOSIS — I10 Essential (primary) hypertension: Secondary | ICD-10-CM | POA: Diagnosis not present

## 2019-03-09 NOTE — Progress Notes (Signed)
Primary Physician/Referring:  Jani Gravel, MD  Patient ID: Joshua Shields, male    DOB: [birth year], 74 y.o.   MRN: [id number]  Chief Complaint  Patient presents with  . Paroxysmal atrial fibrillation  . Hypertension  . Follow-up    6 month   HPI:    Joshua Shields  is a 74 y.o. Caucasian male with male  with hypertension, hypercholesterolemia, paroxysmal A fib and PSVT.  On his last office visit 6 months ago, he was found to be in respiratory circulation, and as he was asymptomatic rate control strategy was recommended.  He now presents here for follow-up.  Remains asymptomatic.  Past Medical History:  Diagnosis Date  . A-fib ([place]) 07/29/2015  . Arthritis    hand  . Complication of anesthesia    cardiac arrest  . Dysrhythmia    a-fib  . Headache   . Hyperlipidemia   . Hypertension    Past Surgical History:  Procedure Laterality Date  . COLONOSCOPY    . ETHMOIDECTOMY Right 04/17/2016   Procedure: RIGHT TOTAL ETHMOIDECTOMY;  Surgeon: Leta Baptist, MD;  Location: [facility] [place];  Service: ENT;  Laterality: Right;  . FRONTAL SINUS EXPLORATION N/A 04/17/2016   Procedure: FRONTAL RECESS EXPLORATION;  Surgeon: Leta Baptist, MD;  Location: [facility];  Service: ENT;  Laterality: N/A;  . MAXILLARY ANTROSTOMY Right 04/17/2016   Procedure: RIGHT MAXILLARY ANTROSTOMY WITH TISSUE REMOVAL;  Surgeon: Leta Baptist, MD;  [facility];  Service: ENT;  Laterality: Right;  . NO PAST SURGERIES    . SINUS ENDO WITH FUSION N/A 04/17/2016   Procedure: SINUS ENDO WITH FUSION;  Surgeon: Leta Baptist, MD;  [facility];  Service: ENT;  Laterality: N/A;   Family History  Problem Relation Age of Onset  . Stroke Mother   . Stroke Father     Social History   Tobacco Use  . Smoking status: Never Smoker  . Smokeless tobacco: Never Used  Substance Use Topics  . Alcohol use: Yes    Comment: social   ROS  Review of Systems  Cardiovascular: Negative for chest pain, dyspnea on exertion and leg swelling.  Gastrointestinal:  Negative for melena.   Objective  Blood pressure 107/76, pulse 69, temperature (!) 96.5 F (35.8 C), temperature source Temporal, resp. rate 16, height 6' (1.829 m), weight 214 lb 1.6 oz (97.1 kg), SpO2 94 %.  Vitals with BMI 03/09/2019 09/07/2018 05/03/2018  Height _0  _1  _2   Weight 214 lbs 2 oz 208 lbs 202 lbs  BMI 29.03 04.5 99.77  Systolic 414 239 (No Data)  Diastolic 76 79 (No Data)  Pulse 69 71 -     Physical Exam  Cardiovascular: Normal rate and intact distal pulses. An irregularly irregular rhythm present. Exam reveals no gallop.  Murmur heard.  Early systolic murmur is present with a grade of 2/6. No leg edema, no JVD.  S1 is variable, S2 is normal, no murmur..  No leg edema, no JVD.  Pulmonary/Chest: Effort normal and breath sounds normal.  Abdominal: Soft. Bowel sounds are normal.   Laboratory examination:   No results for input(s): NA, K, CL, CO2, GLUCOSE, BUN, CREATININE, CALCIUM, GFRNONAA, GFRAA in the last 8760 hours. CrCl cannot be calculated (Patient's most recent lab result is older than the maximum 21 days allowed.).  CMP Latest Ref Rng & Units 04/13/2016 11/19/2015 11/18/2015  Glucose 65 - 99 mg/dL 96 144(H) 111(H)  BUN 6 - 20 mg/dL 19 18 24(H)  Creatinine 0.61 - 1.24 mg/dL 1.05 1.11 1.20  Sodium 135 - 145 mmol/L 137 136 141  Potassium 3.5 - 5.1 mmol/L 4.0 4.5 3.8  Chloride 101 - 111 mmol/L 107 106 102  CO2 22 - 32 mmol/L 22 23 -  Calcium 8.9 - 10.3 mg/dL 9.0 8.8(L) -  Total Protein 6.5 - 8.1 g/dL - - -  Total Bilirubin 0.3 - 1.2 mg/dL - - -  Alkaline Phos 38 - 126 U/L - - -  AST 15 - 41 U/L - - -  ALT 17 - 63 U/L - - -   CBC Latest Ref Rng & Units 04/13/2016 11/19/2015 11/18/2015  WBC 4.0 - 10.5 K/uL 6.2 7.2 -  Hemoglobin 13.0 - 17.0 g/dL 16.1 13.8 15.3  Hematocrit 39.0 - 52.0 % 46.3 41.3 45.0  Platelets 150 - 400 K/uL 159 134(L) -   Lipid Panel     Component Value Date/Time   CHOL 139 11/19/2015 0809   TRIG 230 (H) 11/19/2015 0809   HDL 37 (L)  11/19/2015 0809   CHOLHDL 3.8 11/19/2015 0809   VLDL 46 (H) 11/19/2015 0809   LDLCALC 56 11/19/2015 0809   HEMOGLOBIN A1C No results found for: HGBA1C, MPG TSH No results for input(s): TSH in the last 8760 hours.  External labs:   Labs 05/26/2018: Hb 15.9/HCT 45.9, platelets 156.  Normal indicis. Total cholesterol 159, triglycerides 138, HDL 46, LDL 85.  Non-HDL cholesterol 113. Serum glucose 96 mg, BUN 19, creatinine 1.0, EGFR >60 mL, potassium 4.2.  CMP normal.  PSA normal, TSH normal.  Medications and allergies   Allergies  Allergen Reactions  . Penicillins Hives    Has patient had a PCN reaction causing IMMEDIATE RASH, FACIAL/TONGUE/THROAT SWELLING, SOB, OR LIGHTHEADEDNESS WITH HYPOTENSION:  #  #  #  YES  #  #  #   Has patient had a PCN reaction causing severe rash involving mucus membranes or skin necrosis: No Has patient had a PCN reaction that required hospitalization No Has patient had a PCN reaction occurring within the last 10 years: No If all of the above answers are "NO", then may proceed with Cephalosporin use.      Current Outpatient Medications  Medication Instructions  . acetaminophen (TYLENOL) 1,000 mg, Oral, Every 6 hours PRN  . lisinopril (ZESTRIL) 20 mg, Oral, Daily  . metoprolol tartrate (LOPRESSOR) 25 MG tablet TAKE 1 TABLET BY MOUTH DAILY  . simvastatin (ZOCOR) 20 mg, Oral, Daily-1800  . XARELTO 20 MG TABS tablet TAKE 1 TABLET BY MOUTH EVERY EVENING AFTER DINNER   Radiology:   No results found.  Cardiac Studies:   Event monitor-12/09/2015-01/07/2016-sinus rhythm, occasional PACs, PVCs. Few runs of Paroxysmal atrial fibrillation with rapid ventricular response, maximum heart rate with A. fib was 160/m. Occasional sinus bradycardia, minimum heart rate was 48/m. Patient had reported rapid, fast heartbeat during the runs of atrial fibrillation. He reported "flutter or skipping" during PVCs.  Echo- 01/02/2015 1. Left ventricle cavity is normal in size.  Normal global wall motion. Doppler evidence of grade I (impaired) diastolic dysfunction. Calculated EF 65%. 2. Mild mitral regurgitation. 3. Mild tricuspid regurgitation. No evidence of pulmonary Hypertension.  Lexiscan myoview stress test 12/12/2015: 1. The resting electrocardiogram demonstrated normal sinus rhythm, RBBB and no resting arrhythmias. Stress EKG is non-diagnostic for ischemia as it a pharmacologic stress using Lexiscan. There were PACs and one 3 beat atrial ectopic beats during recovery phase with Lexiscan infusion. 2. Myocardial perfusion imaging is normal. Overall left ventricular  systolic function was normal without regional wall motion abnormalities. The left ventricular ejection fraction was 77%.  EKG 03/09/2019: Atrial fibrillation with controlled ventricular response at the rate of 85 bpm, right axis.  right bundle branch block.  No evidence of ischemia.  Normal QT interval.  Pulmonary disease pattern. No significant change from  EKG 09/07/2018: Atrial fibrillation at 82 bpm, right axis deviation, right bundle branch block, no evidence of ischemia.   Assessment     ICD-10-CM   1. Permanent atrial fibrillation (HCC)  I48.21 EKG 12-Lead  2. Essential hypertension  I10   3. Hypercholesteremia  E78.00    No orders of the defined types were placed in this encounter.   There are no discontinued medications.   Recommendations:   Joshua Shields  is a 74 y.o. Caucasian male with male  with hypertension, hypercholesterolemia, paroxysmal A fib and PSVT.  On his last office visit 6 months ago, he was found to be in respiratory circulation, and as he was asymptomatic rate control strategy was recommended.  He now presents here for follow-up.   He is asymptomatic with regard to atrial fibrillation, hence continue rate control strategy.  I discussed with him regarding anticoagulation, risk benefits.  Blood pressure is well controlled, reviewed his external labs, lipids are also  under excellent control.  No changes in the medications were done today.  I will see him back on annual basis.  Adrian Prows, MD, [facility] 03/09/2019, 11:13 AM [facility]. [place] Office: [phone]

## 2019-05-25 DIAGNOSIS — Z125 Encounter for screening for malignant neoplasm of prostate: Secondary | ICD-10-CM | POA: Diagnosis not present

## 2019-05-25 DIAGNOSIS — E7800 Pure hypercholesterolemia, unspecified: Secondary | ICD-10-CM | POA: Diagnosis not present

## 2019-05-25 DIAGNOSIS — I10 Essential (primary) hypertension: Secondary | ICD-10-CM | POA: Diagnosis not present

## 2019-06-01 DIAGNOSIS — Z0000 Encounter for general adult medical examination without abnormal findings: Secondary | ICD-10-CM | POA: Diagnosis not present

## 2019-06-01 DIAGNOSIS — I4891 Unspecified atrial fibrillation: Secondary | ICD-10-CM | POA: Diagnosis not present

## 2019-06-01 DIAGNOSIS — E7800 Pure hypercholesterolemia, unspecified: Secondary | ICD-10-CM | POA: Diagnosis not present

## 2019-06-01 DIAGNOSIS — I10 Essential (primary) hypertension: Secondary | ICD-10-CM | POA: Diagnosis not present

## 2019-06-09 DIAGNOSIS — Z1212 Encounter for screening for malignant neoplasm of rectum: Secondary | ICD-10-CM | POA: Diagnosis not present

## 2019-06-09 DIAGNOSIS — Z1211 Encounter for screening for malignant neoplasm of colon: Secondary | ICD-10-CM | POA: Diagnosis not present

## 2019-06-14 LAB — COLOGUARD: COLOGUARD: NEGATIVE

## 2019-07-25 DIAGNOSIS — I10 Essential (primary) hypertension: Secondary | ICD-10-CM | POA: Diagnosis not present

## 2019-10-09 DIAGNOSIS — Z23 Encounter for immunization: Secondary | ICD-10-CM | POA: Diagnosis not present

## 2019-10-10 DIAGNOSIS — Z85828 Personal history of other malignant neoplasm of skin: Secondary | ICD-10-CM | POA: Diagnosis not present

## 2019-10-10 DIAGNOSIS — D045 Carcinoma in situ of skin of trunk: Secondary | ICD-10-CM | POA: Diagnosis not present

## 2019-10-10 DIAGNOSIS — C44311 Basal cell carcinoma of skin of nose: Secondary | ICD-10-CM | POA: Diagnosis not present

## 2019-10-10 DIAGNOSIS — D1801 Hemangioma of skin and subcutaneous tissue: Secondary | ICD-10-CM | POA: Diagnosis not present

## 2019-10-10 DIAGNOSIS — C44229 Squamous cell carcinoma of skin of left ear and external auricular canal: Secondary | ICD-10-CM | POA: Diagnosis not present

## 2019-10-10 DIAGNOSIS — D485 Neoplasm of uncertain behavior of skin: Secondary | ICD-10-CM | POA: Diagnosis not present

## 2019-10-10 DIAGNOSIS — L820 Inflamed seborrheic keratosis: Secondary | ICD-10-CM | POA: Diagnosis not present

## 2019-10-10 DIAGNOSIS — L570 Actinic keratosis: Secondary | ICD-10-CM | POA: Diagnosis not present

## 2019-10-10 DIAGNOSIS — L821 Other seborrheic keratosis: Secondary | ICD-10-CM | POA: Diagnosis not present

## 2019-10-21 ENCOUNTER — Other Ambulatory Visit: Payer: Self-pay | Admitting: Cardiology

## 2019-11-28 DIAGNOSIS — E7800 Pure hypercholesterolemia, unspecified: Secondary | ICD-10-CM | POA: Diagnosis not present

## 2019-12-04 DIAGNOSIS — I10 Essential (primary) hypertension: Secondary | ICD-10-CM | POA: Diagnosis not present

## 2019-12-04 DIAGNOSIS — I4891 Unspecified atrial fibrillation: Secondary | ICD-10-CM | POA: Diagnosis not present

## 2019-12-04 DIAGNOSIS — E7800 Pure hypercholesterolemia, unspecified: Secondary | ICD-10-CM | POA: Diagnosis not present

## 2019-12-04 DIAGNOSIS — Z23 Encounter for immunization: Secondary | ICD-10-CM | POA: Diagnosis not present

## 2020-02-15 DIAGNOSIS — Z9181 History of falling: Secondary | ICD-10-CM | POA: Diagnosis not present

## 2020-02-15 DIAGNOSIS — R0781 Pleurodynia: Secondary | ICD-10-CM | POA: Diagnosis not present

## 2020-02-15 DIAGNOSIS — R0989 Other specified symptoms and signs involving the circulatory and respiratory systems: Secondary | ICD-10-CM | POA: Diagnosis not present

## 2020-02-15 DIAGNOSIS — M545 Low back pain, unspecified: Secondary | ICD-10-CM | POA: Diagnosis not present

## 2020-03-05 DIAGNOSIS — Z9181 History of falling: Secondary | ICD-10-CM | POA: Diagnosis not present

## 2020-03-08 ENCOUNTER — Other Ambulatory Visit: Payer: Self-pay

## 2020-03-08 ENCOUNTER — Ambulatory Visit: Payer: Medicare Other | Admitting: Cardiology

## 2020-03-08 ENCOUNTER — Encounter: Payer: Self-pay | Admitting: Cardiology

## 2020-03-08 VITALS — BP 114/71 | HR 78 | Temp 98.3°F | Resp 17 | Ht 72.0 in | Wt 213.4 lb

## 2020-03-08 DIAGNOSIS — I10 Essential (primary) hypertension: Secondary | ICD-10-CM | POA: Diagnosis not present

## 2020-03-08 DIAGNOSIS — I4821 Permanent atrial fibrillation: Secondary | ICD-10-CM | POA: Diagnosis not present

## 2020-03-08 DIAGNOSIS — E7800 Pure hypercholesterolemia, unspecified: Secondary | ICD-10-CM | POA: Diagnosis not present

## 2020-03-08 NOTE — Progress Notes (Signed)
Primary Physician/Referring:  Jani Gravel, MD  Patient ID: Joshua Shields, male    DOB: 1945-07-18, 75 y.o.   MRN: [id number]  Chief Complaint  Patient presents with  . Atrial Fibrillation  . Hypertension  . Follow-up    1 year   HPI:    Joshua Shields  is a 75 y.o. Caucasian male with male  with hypertension, hypercholesterolemia, paroxysmal A fib and PSVT.  On his last office visit 6 months ago, he was found to be in respiratory circulation, and as he was asymptomatic rate control strategy was recommended.  He now presents here for follow-up.  Remains asymptomatic.  He recently had a fall during winter after he stepped on the ice and had hurt his back.  He had been bedridden for over a week but fortunately has recuperated mostly and still has very mild back pain.  He is starting to walk again.  Past Medical History:  Diagnosis Date  . A-fib ([place])   . Arthritis    hand  . Complication of anesthesia    cardiac arrest  . Dysrhythmia    a-fib  . Headache   . Hyperlipidemia   . Hypertension    Past Surgical History:  Procedure Laterality Date  . COLONOSCOPY    . ETHMOIDECTOMY Right 04/17/2016   Procedure: RIGHT TOTAL ETHMOIDECTOMY;  Surgeon: Leta Baptist, MD;  Location: [place];  Service: ENT;  Laterality: Right;  . FRONTAL SINUS EXPLORATION N/A 04/17/2016   Procedure: FRONTAL RECESS EXPLORATION;  Surgeon: Leta Baptist, MD;  Location: [place];  Service: ENT;  Laterality: N/A;  . MAXILLARY ANTROSTOMY Right 04/17/2016   Procedure: RIGHT MAXILLARY ANTROSTOMY WITH TISSUE REMOVAL;  Surgeon: Leta Baptist, MD;  [facility];  Service: ENT;  Laterality: Right;  . NO PAST SURGERIES    . SINUS ENDO WITH FUSION N/A 04/17/2016   Procedure: SINUS ENDO WITH FUSION;  Surgeon: Leta Baptist, MD;  Location[facility];  Service: ENT;  Laterality: N/A;   Family History  Problem Relation Age of Onset  . Stroke Mother   . Stroke Father     Social History   Tobacco Use  . Smoking status: Never Smoker  . Smokeless  tobacco: Never Used  Substance Use Topics  . Alcohol use: Yes    Comment: social   ROS  Review of Systems  Cardiovascular: Negative for chest pain, dyspnea on exertion and leg swelling.  Musculoskeletal: Positive for back pain and joint pain.  Gastrointestinal: Negative for melena.   Objective  Blood pressure 114/71, pulse 78, temperature 98.3 F (36.8 C), resp. rate 17, height 6' (1.829 m), weight 213 lb 6.4 oz (96.8 kg), SpO2 98 %.  Vitals with BMI 03/08/2020 03/09/2019 09/07/2018  Height '6\' 0"'  '6\' 0"'  '6\' 0"'   Weight 213 lbs 6 oz 214 lbs 2 oz 208 lbs  BMI 28.94 88.28 00.3  Systolic 491 791 505  Diastolic 71 76 79  Pulse 78 69 71     Physical Exam Constitutional:      Appearance: Normal appearance.  Cardiovascular:     Rate and Rhythm: Normal rate. Rhythm irregularly irregular.     Pulses: Intact distal pulses.     Heart sounds: Murmur heard.   Early systolic murmur is present with a grade of 2/6. No gallop.      Comments: No leg edema, no JVD.  S1 is variable, S2 is normal, no murmur..  No leg edema, no JVD. Pulmonary:     Effort: Pulmonary  effort is normal.     Breath sounds: Normal breath sounds.  Abdominal:     General: Bowel sounds are normal.     Palpations: Abdomen is soft.  Musculoskeletal:        General: Normal range of motion.  Skin:    General: Skin is warm and dry.    Laboratory examination:   External labs:    Labs reviewed 02/15/2020:  Sodium 143, potassium 4.7, BUN 23, creatinine 1.0, EGFR 77 mL, CMP normal.  Total cholesterol 190, triglycerides 164, HDL 48, LDL 109.  Non-HDL cholesterol 142.  Hb 16.1/HCT 46.8, platelets 141.  Labs 09/13/2015: TSH normal.  Labs 05/26/2018:   Hb 15.9/HCT 45.9, platelets 156.  Normal indicis. Total cholesterol 159, triglycerides 138, HDL 46, LDL 85.  Non-HDL cholesterol 113. Serum glucose 96 mg, BUN 19, creatinine 1.0, EGFR >60 mL, potassium 4.2.  CMP normal.  PSA normal, TSH normal.  Medications and allergies    Allergies  Allergen Reactions  . Penicillins Hives    Has patient had a PCN reaction causing IMMEDIATE RASH, FACIAL/TONGUE/THROAT SWELLING, SOB, OR LIGHTHEADEDNESS WITH HYPOTENSION:  #  #  #  YES  #  #  #   Has patient had a PCN reaction causing severe rash involving mucus membranes or skin necrosis: No Has patient had a PCN reaction that required hospitalization No Has patient had a PCN reaction occurring within the last 10 years: No If all of the above answers are "NO", then may proceed with Cephalosporin use.     Current Outpatient Medications on File Prior to Visit  Medication Sig Dispense Refill  . acetaminophen (TYLENOL) 500 MG tablet Take 1,000 mg by mouth every 6 (six) hours as needed for headache.    . lisinopril (PRINIVIL,ZESTRIL) 20 MG tablet Take 20 mg by mouth daily.    . metoprolol tartrate (LOPRESSOR) 25 MG tablet TAKE 1 TABLET BY MOUTH DAILY 90 tablet 1  . simvastatin (ZOCOR) 20 MG tablet Take 20 mg by mouth daily at 6 PM.     . XARELTO 20 MG TABS tablet TAKE 1 TABLET BY MOUTH EVERY EVENING AFTER DINNER 90 tablet 3   No current facility-administered medications on file prior to visit.    Radiology:   No results found.  Cardiac Studies:   Event monitor-12/09/2015-01/07/2016-sinus rhythm, occasional PACs, PVCs. Few runs of Paroxysmal atrial fibrillation with rapid ventricular response, maximum heart rate with A. fib was 160/m. Occasional sinus bradycardia, minimum heart rate was 48/m. Patient had reported rapid, fast heartbeat during the runs of atrial fibrillation. He reported "flutter or skipping" during PVCs.  Echo- 01/02/2015 1. Left ventricle cavity is normal in size. Normal global wall motion. Doppler evidence of grade I (impaired) diastolic dysfunction. Calculated EF 65%. 2. Mild mitral regurgitation. 3. Mild tricuspid regurgitation. No evidence of pulmonary Hypertension.  Lexiscan myoview stress test 12/12/2015: 1. The resting electrocardiogram demonstrated  normal sinus rhythm, RBBB and no resting arrhythmias. Stress EKG is non-diagnostic for ischemia as it a pharmacologic stress using Lexiscan. There were PACs and one 3 beat atrial ectopic beats during recovery phase with Lexiscan infusion. 2. Myocardial perfusion imaging is normal. Overall left ventricular systolic function was normal without regional wall motion abnormalities. The left ventricular ejection fraction was 77%.  EKG:   EKG 03/08/2020: Atrial fibrillation with controlled ventricular response at the rate of 96 bpm, right axis, right bundle branch block.  No evidence of ischemia.  Low-voltage complexes.  Consider pulmonary disease pattern.  No significant change from  EKG 03/09/2019,  09/07/2018   Assessment     ICD-10-CM   1. Permanent atrial fibrillation (HCC)  I48.21 EKG 12-Lead    PCV ECHOCARDIOGRAM COMPLETE  2. Essential hypertension  I10   3. Hypercholesteremia  E78.00     CHA2DS2-VASc Score is 2.  Yearly risk of stroke: 2.3% (A, HTN).  Score of 1=0.6; 2=2.2; 3=3.2; 4=4.8; 5=7.2; 6=9.8; 7=>9.8) -(CHF; HTN; vasc disease DM,  Male = 1; Age <65 =0; 65-74 = 1,  >75 =2; stroke/embolism= 2).    No orders of the defined types were placed in this encounter. There are no discontinued medications. Orders Placed This Encounter  Procedures  . EKG 12-Lead  . PCV ECHOCARDIOGRAM COMPLETE    Standing Status:   Future    Standing Expiration Date:   03/08/2021     Recommendations:   Joshua Shields  is a 75 y.o. Caucasian male with male  with hypertension, hypercholesterolemia, paroxysmal A fib and PSVT.  On his last office visit 1 year ago, he was in atrial fibrillation and as he was asymptomatic rate control strategy was recommended.  He now presents here for follow-up.   He is asymptomatic with regard to atrial fibrillation, hence continue rate control strategy.    I discussed with him regarding anticoagulation, risk benefits, low cardio-embolic risk, patient prefers to be on  anticoagulation due to low bleed risk.  Blood pressure is well controlled, reviewed his external labs, lipids are also under excellent control.  No changes in the medications were done today.  I will see him back on annual basis. Will obtain Echo to evaluate LVEF as he is now in persistent atrial fibrillation.   Adrian Prows, MD, [facility] 03/08/2020, 11:16 AM Office: [phone] Pager: [phone]

## 2020-03-13 ENCOUNTER — Ambulatory Visit: Payer: Medicare Other

## 2020-03-13 ENCOUNTER — Other Ambulatory Visit: Payer: Self-pay

## 2020-03-13 DIAGNOSIS — I4821 Permanent atrial fibrillation: Secondary | ICD-10-CM | POA: Diagnosis not present

## 2020-03-15 NOTE — Progress Notes (Signed)
Essentially normal echocardiogram with minor abnormality.  Left message on his phone.

## 2020-04-06 DIAGNOSIS — Z23 Encounter for immunization: Secondary | ICD-10-CM | POA: Diagnosis not present

## 2020-04-09 DIAGNOSIS — D2272 Melanocytic nevi of left lower limb, including hip: Secondary | ICD-10-CM | POA: Diagnosis not present

## 2020-04-09 DIAGNOSIS — Z85828 Personal history of other malignant neoplasm of skin: Secondary | ICD-10-CM | POA: Diagnosis not present

## 2020-04-09 DIAGNOSIS — L570 Actinic keratosis: Secondary | ICD-10-CM | POA: Diagnosis not present

## 2020-04-09 DIAGNOSIS — L821 Other seborrheic keratosis: Secondary | ICD-10-CM | POA: Diagnosis not present

## 2020-04-09 DIAGNOSIS — D2271 Melanocytic nevi of right lower limb, including hip: Secondary | ICD-10-CM | POA: Diagnosis not present

## 2020-04-09 DIAGNOSIS — L820 Inflamed seborrheic keratosis: Secondary | ICD-10-CM | POA: Diagnosis not present

## 2020-04-09 DIAGNOSIS — D225 Melanocytic nevi of trunk: Secondary | ICD-10-CM | POA: Diagnosis not present

## 2020-04-18 ENCOUNTER — Other Ambulatory Visit: Payer: Self-pay | Admitting: Cardiology

## 2020-05-31 DIAGNOSIS — Z0000 Encounter for general adult medical examination without abnormal findings: Secondary | ICD-10-CM | POA: Diagnosis not present

## 2020-05-31 DIAGNOSIS — E559 Vitamin D deficiency, unspecified: Secondary | ICD-10-CM | POA: Diagnosis not present

## 2020-05-31 DIAGNOSIS — Z125 Encounter for screening for malignant neoplasm of prostate: Secondary | ICD-10-CM | POA: Diagnosis not present

## 2020-07-05 DIAGNOSIS — I10 Essential (primary) hypertension: Secondary | ICD-10-CM | POA: Diagnosis not present

## 2020-07-05 DIAGNOSIS — E7800 Pure hypercholesterolemia, unspecified: Secondary | ICD-10-CM | POA: Diagnosis not present

## 2020-07-05 DIAGNOSIS — I4891 Unspecified atrial fibrillation: Secondary | ICD-10-CM | POA: Diagnosis not present

## 2020-07-05 DIAGNOSIS — L820 Inflamed seborrheic keratosis: Secondary | ICD-10-CM | POA: Diagnosis not present

## 2020-07-05 DIAGNOSIS — R319 Hematuria, unspecified: Secondary | ICD-10-CM | POA: Diagnosis not present

## 2020-07-05 DIAGNOSIS — Z7901 Long term (current) use of anticoagulants: Secondary | ICD-10-CM | POA: Diagnosis not present

## 2020-07-05 DIAGNOSIS — Z0000 Encounter for general adult medical examination without abnormal findings: Secondary | ICD-10-CM | POA: Diagnosis not present

## 2020-09-23 DIAGNOSIS — Z23 Encounter for immunization: Secondary | ICD-10-CM | POA: Diagnosis not present

## 2020-10-15 ENCOUNTER — Other Ambulatory Visit: Payer: Self-pay | Admitting: Cardiology

## 2020-10-22 DIAGNOSIS — L723 Sebaceous cyst: Secondary | ICD-10-CM | POA: Diagnosis not present

## 2020-10-22 DIAGNOSIS — L821 Other seborrheic keratosis: Secondary | ICD-10-CM | POA: Diagnosis not present

## 2020-10-22 DIAGNOSIS — D485 Neoplasm of uncertain behavior of skin: Secondary | ICD-10-CM | POA: Diagnosis not present

## 2020-10-22 DIAGNOSIS — L820 Inflamed seborrheic keratosis: Secondary | ICD-10-CM | POA: Diagnosis not present

## 2020-10-22 DIAGNOSIS — L929 Granulomatous disorder of the skin and subcutaneous tissue, unspecified: Secondary | ICD-10-CM | POA: Diagnosis not present

## 2020-10-22 DIAGNOSIS — Z85828 Personal history of other malignant neoplasm of skin: Secondary | ICD-10-CM | POA: Diagnosis not present

## 2020-10-22 DIAGNOSIS — L570 Actinic keratosis: Secondary | ICD-10-CM | POA: Diagnosis not present

## 2021-01-08 DIAGNOSIS — E7800 Pure hypercholesterolemia, unspecified: Secondary | ICD-10-CM | POA: Diagnosis not present

## 2021-01-14 DIAGNOSIS — Z7901 Long term (current) use of anticoagulants: Secondary | ICD-10-CM | POA: Diagnosis not present

## 2021-01-14 DIAGNOSIS — I4891 Unspecified atrial fibrillation: Secondary | ICD-10-CM | POA: Diagnosis not present

## 2021-01-14 DIAGNOSIS — E7800 Pure hypercholesterolemia, unspecified: Secondary | ICD-10-CM | POA: Diagnosis not present

## 2021-01-14 DIAGNOSIS — I10 Essential (primary) hypertension: Secondary | ICD-10-CM | POA: Diagnosis not present

## 2021-01-17 NOTE — Progress Notes (Signed)
Labs 01/08/2021:  Potassium 4.6, BUN 20, creatinine 1.06, EGFR 68 mL, serum glucose 96 mg, CMP otherwise normal.  Total cholesterol 170, triglycerides 151, HDL 41, LDL 99.

## 2021-03-13 ENCOUNTER — Ambulatory Visit: Payer: Medicare Other | Admitting: Cardiology

## 2021-04-04 ENCOUNTER — Ambulatory Visit: Payer: Medicare Other | Admitting: Cardiology

## 2021-04-04 ENCOUNTER — Encounter: Payer: Self-pay | Admitting: Cardiology

## 2021-04-04 VITALS — BP 117/81 | HR 86 | Temp 98.0°F | Resp 16 | Ht 72.0 in | Wt 210.2 lb

## 2021-04-04 DIAGNOSIS — I10 Essential (primary) hypertension: Secondary | ICD-10-CM

## 2021-04-04 DIAGNOSIS — E7800 Pure hypercholesterolemia, unspecified: Secondary | ICD-10-CM

## 2021-04-04 DIAGNOSIS — I4821 Permanent atrial fibrillation: Secondary | ICD-10-CM | POA: Diagnosis not present

## 2021-04-04 NOTE — Progress Notes (Signed)
? ?Primary Physician/Referring:  Holland Commons, FNP ? ?Patient ID: Joshua Shields, male    DOB: [birth year], 76 y.o.   MRN: [id number] ? ?Chief Complaint  ?Patient presents with  ? Atrial Fibrillation  ? Hyperlipidemia  ? Hypertension  ? Follow-up  ?  1 year  ? ?HPI:   ? ?Joshua Shields  is a 76 y.o. Caucasian male with male  with hypertension, hypercholesterolemia, permanent A fib and PSVT.   He now presents here for annual follow-up.  ? ?He is asymptomatic. Tolerating anticoagulation well.  ? ?Past Medical History:  ?Diagnosis Date  ? A-fib ([place])   ? Arthritis   ? hand  ? Complication of anesthesia   ? cardiac arrest  ? Dysrhythmia   ? a-fib  ? Headache   ? Hyperlipidemia   ? Hypertension   ? ?Past Surgical History:  ?Procedure Laterality Date  ? COLONOSCOPY    ? ETHMOIDECTOMY Right 04/17/2016  ? Procedure: RIGHT TOTAL ETHMOIDECTOMY;  Surgeon: Leta Baptist, MD;  Location: [facility] [facility];  Service: ENT;  Laterality: Right;  ? FRONTAL SINUS EXPLORATION N/A 04/17/2016  ? Procedure: FRONTAL RECESS EXPLORATION;  Surgeon: Leta Baptist, MD;  Location: [place];  Service: ENT;  Laterality: N/A;  ? MAXILLARY ANTROSTOMY Right 04/17/2016  ? Procedure: RIGHT MAXILLARY ANTROSTOMY WITH TISSUE REMOVAL;  Surgeon: Leta Baptist, MD;  Location: [place];  Service: ENT;  Laterality: Right;  ? NO PAST SURGERIES    ? SINUS ENDO WITH FUSION N/A 04/17/2016  ? Procedure: SINUS ENDO WITH FUSION;  Surgeon: Leta Baptist, MD;  Location: [place];  Service: ENT;  Laterality: N/A;  ? ?Family History  ?Problem Relation Age of Onset  ? Stroke Mother   ? Stroke Father   ?  ?Social History  ? ?Tobacco Use  ? Smoking status: Never  ? Smokeless tobacco: Never  ?Substance Use Topics  ? Alcohol use: Yes  ?  Comment: social  ? ?ROS  ?Review of Systems  ?Cardiovascular:  Negative for chest pain, dyspnea on exertion and leg swelling.  ?Musculoskeletal:  Positive for back pain and joint pain.  ?Gastrointestinal:  Negative for melena.  ?Objective  ?Blood pressure 117/81, pulse 86, temperature 98  ?F (36.7 ?C), temperature source Temporal, resp. rate 16, height 6' (1.829 m), weight 210 lb 3.2 oz (95.3 kg), SpO2 94 %.  ? ?  04/04/2021  ? 10:06 AM 03/08/2020  ? 10:37 AM 03/09/2019  ? 10:29 AM  ?Vitals with BMI  ?Height '6\' 0"'  '6\' 0"'  '6\' 0"'   ?Weight 210 lbs 3 oz 213 lbs 6 oz 214 lbs 2 oz  ?BMI 28.5 28.94 29.03  ?Systolic 350 093 818  ?Diastolic 81 71 76  ?Pulse 86 78 69  ?  ? Physical Exam ?Neck:  ?   Vascular: No JVD.  ?Cardiovascular:  ?   Rate and Rhythm: Normal rate. Rhythm irregularly irregular.  ?   Pulses: Intact distal pulses.  ?   Heart sounds: Murmur heard.  ?Early systolic murmur is present with a grade of 2/6.  ?  No gallop.  ?Pulmonary:  ?   Effort: Pulmonary effort is normal.  ?   Breath sounds: Normal breath sounds.  ?Abdominal:  ?   General: Bowel sounds are normal.  ?   Palpations: Abdomen is soft.  ?Musculoskeletal:  ?   Right lower leg: No edema.  ?   Left lower leg: No edema.  ? ?Laboratory examination:  ? ?External labs:  ? ?Labs 01/08/2021: ? ?Total  cholesterol 170, triglycerides 151, HDL 41, LDL 99.  Non-HDL cholesterol 129. ? ?BUN 20, creatinine 1.06, EGFR 68 mL, potassium 4.6.  Single, discussed LFT normal. ? ?Labs 05/31/2020: ? ?PSA normal, TSH normal 96 mg, BUN 19, creatinine 1.0, EGFR >60 mL, potassium 4.2.  CMP normal.  PSA normal, TSH normal. ? ?Medications and allergies  ? ?Allergies  ?Allergen Reactions  ? Penicillins Hives  ?  Has patient had a PCN reaction causing IMMEDIATE RASH, FACIAL/TONGUE/THROAT SWELLING, SOB, OR LIGHTHEADEDNESS WITH HYPOTENSION:  #  #  #  YES  #  #  #   ?Has patient had a PCN reaction causing severe rash involving mucus membranes or skin necrosis: No ?Has patient had a PCN reaction that required hospitalization No ?Has patient had a PCN reaction occurring within the last 10 years: No ?If all of the above answers are "NO", then may proceed with Cephalosporin use. ?  ?  ?Current Outpatient Medications:  ?  acetaminophen (TYLENOL) 500 MG tablet, Take 1,000 mg by mouth  every 6 (six) hours as needed for headache., Disp: , Rfl:  ?  lisinopril (PRINIVIL,ZESTRIL) 20 MG tablet, Take 20 mg by mouth daily., Disp: , Rfl:  ?  metoprolol tartrate (LOPRESSOR) 25 MG tablet, TAKE 1 TABLET BY MOUTH DAILY, Disp: 90 tablet, Rfl: 1 ?  simvastatin (ZOCOR) 20 MG tablet, Take 20 mg by mouth daily at 6 PM. , Disp: , Rfl:  ?  XARELTO 20 MG TABS tablet, TAKE 1 TABLET BY MOUTH EVERY EVENING AFTER DINNER, Disp: 90 tablet, Rfl: 3  ?  ?Radiology:  ? ?No results found. ? ?Cardiac Studies:  ? ?Event monitor-12/09/2015-01/07/2016-sinus rhythm, occasional PACs, PVCs. Few runs of Paroxysmal atrial fibrillation with rapid ventricular response, maximum heart rate with A. fib was 160/m. Occasional sinus bradycardia, minimum heart rate was 48/m. Patient had reported rapid, fast heartbeat during the runs of atrial fibrillation. He reported "flutter or skipping" during PVCs. ? ?Echo- 03/13/2020 ?Normal LV systolic function with visual EF 55-60%. Left ventricle cavity  ?is normal in size. Normal global wall motion. Unable to evaluate diastolic  ?function due to underlying rhythm. Normal LAP.  ?Left atrial cavity is mildly dilated.  ?Mild (Grade I) mitral regurgitation.  ?No other significant valvular abnormalities.  ?Compared to prior study dated 01/02/2015: no significant change.  ? ?Lexiscan myoview stress test 12/12/2015: ?1. The resting electrocardiogram demonstrated normal sinus rhythm, RBBB and no resting arrhythmias.  Stress EKG is non-diagnostic for ischemia as it a pharmacologic stress using Lexiscan. There were PACs and one 3 beat atrial ectopic beats during recovery phase with Lexiscan infusion. ?2. Myocardial perfusion imaging is normal. Overall left ventricular systolic function was normal without regional wall motion abnormalities. The left ventricular ejection fraction was 77%. ? ?EKG: ? ?EKG 04/04/2021: Atrial fibrillation with controlled ventricular response at rate of 80 bpm, normal axis, right bundle  branch block.  No evidence of ischemia.  No significant change from 03/08/2020 and 09/07/2018. ? ?Assessment  ? ?  ICD-10-CM   ?1. Permanent atrial fibrillation (HCC)  I48.21 EKG 12-Lead  ?  ?2. Essential hypertension  I10   ?  ?3. Hypercholesteremia  E78.00   ?  ?  ?CHA2DS2-VASc Score is 2.  Yearly risk of stroke: 2.3% (A, HTN).  Score of 1=0.6; 2=2.2; 3=3.2; 4=4.8; 5=7.2; 6=9.8; 7=>9.8) ?-(CHF; HTN; vasc disease DM,  Male = 1; Age <65 =0; 65-74 = 1,  >75 =2; stroke/embolism= 2).   ? ?No orders of the defined types were placed  in this encounter. ?There are no discontinued medications. ?Orders Placed This Encounter  ?Procedures  ? EKG 12-Lead  ?   ?Recommendations:  ? ?Joshua Shields  is a 76 y.o. Caucasian male with male  with hypertension, hypercholesterolemia, permanent A fib and PSVT.   He now presents here for annual follow-up.  ? ?He is asymptomatic with regard to atrial fibrillation, hence continue rate control strategy.   ? ?I discussed with him regarding anticoagulation, risk benefits, low cardio-embolic risk, patient prefers to be on anticoagulation due to low bleed risk.   ? ?Blood pressure is well controlled, reviewed his external labs, lipids are also under excellent control.  No changes in the medications were done today.  I will see him back on annual basis.  ? ? ?Adrian Prows, MD, [facility] ?04/04/2021, 10:32 AM ?Office: [phone] ?Pager: [phone]  ?

## 2021-04-13 ENCOUNTER — Other Ambulatory Visit: Payer: Self-pay | Admitting: Cardiology

## 2021-07-10 DIAGNOSIS — R5383 Other fatigue: Secondary | ICD-10-CM | POA: Diagnosis not present

## 2021-07-10 DIAGNOSIS — E7800 Pure hypercholesterolemia, unspecified: Secondary | ICD-10-CM | POA: Diagnosis not present

## 2021-07-10 DIAGNOSIS — I10 Essential (primary) hypertension: Secondary | ICD-10-CM | POA: Diagnosis not present

## 2021-07-21 DIAGNOSIS — Z7901 Long term (current) use of anticoagulants: Secondary | ICD-10-CM | POA: Diagnosis not present

## 2021-07-21 DIAGNOSIS — Z23 Encounter for immunization: Secondary | ICD-10-CM | POA: Diagnosis not present

## 2021-07-21 DIAGNOSIS — Z0000 Encounter for general adult medical examination without abnormal findings: Secondary | ICD-10-CM | POA: Diagnosis not present

## 2021-10-10 ENCOUNTER — Other Ambulatory Visit: Payer: Self-pay | Admitting: Cardiology

## 2021-10-22 DIAGNOSIS — L570 Actinic keratosis: Secondary | ICD-10-CM | POA: Diagnosis not present

## 2021-10-22 DIAGNOSIS — L821 Other seborrheic keratosis: Secondary | ICD-10-CM | POA: Diagnosis not present

## 2021-10-22 DIAGNOSIS — C4442 Squamous cell carcinoma of skin of scalp and neck: Secondary | ICD-10-CM | POA: Diagnosis not present

## 2021-10-22 DIAGNOSIS — D485 Neoplasm of uncertain behavior of skin: Secondary | ICD-10-CM | POA: Diagnosis not present

## 2021-10-22 DIAGNOSIS — L723 Sebaceous cyst: Secondary | ICD-10-CM | POA: Diagnosis not present

## 2021-10-22 DIAGNOSIS — L820 Inflamed seborrheic keratosis: Secondary | ICD-10-CM | POA: Diagnosis not present

## 2021-10-22 DIAGNOSIS — Z85828 Personal history of other malignant neoplasm of skin: Secondary | ICD-10-CM | POA: Diagnosis not present

## 2021-12-08 DIAGNOSIS — E7800 Pure hypercholesterolemia, unspecified: Secondary | ICD-10-CM | POA: Diagnosis not present

## 2021-12-08 DIAGNOSIS — Z0000 Encounter for general adult medical examination without abnormal findings: Secondary | ICD-10-CM | POA: Diagnosis not present

## 2021-12-08 DIAGNOSIS — I10 Essential (primary) hypertension: Secondary | ICD-10-CM | POA: Diagnosis not present

## 2021-12-15 DIAGNOSIS — I4891 Unspecified atrial fibrillation: Secondary | ICD-10-CM | POA: Diagnosis not present

## 2021-12-15 DIAGNOSIS — E7800 Pure hypercholesterolemia, unspecified: Secondary | ICD-10-CM | POA: Diagnosis not present

## 2021-12-15 DIAGNOSIS — Z7901 Long term (current) use of anticoagulants: Secondary | ICD-10-CM | POA: Diagnosis not present

## 2021-12-15 DIAGNOSIS — Z23 Encounter for immunization: Secondary | ICD-10-CM | POA: Diagnosis not present

## 2021-12-15 DIAGNOSIS — I10 Essential (primary) hypertension: Secondary | ICD-10-CM | POA: Diagnosis not present

## 2021-12-17 ENCOUNTER — Encounter: Payer: Self-pay | Admitting: Cardiology

## 2021-12-17 NOTE — Progress Notes (Signed)
Labs 12/08/2021:  Total cholesterol 183, triglycerides 214, HDL 46, LDL 94.  Hb 15.0/HCT 44.4, platelets 147.  BUN 19, creatinine 1.09, EGFR 65 mill, potassium 4.5, LFTs normal.  TSH normal at 2.63.

## 2022-04-06 ENCOUNTER — Ambulatory Visit: Payer: Medicare Other | Admitting: Cardiology

## 2022-04-06 NOTE — Progress Notes (Deleted)
Primary Physician/Referring:  Holland Commons, FNP  Patient ID: [name], male    DOB: 04-19-1945, 77 y.o.   MRN: [id number]  No chief complaint on file.  HPI:    KARSON GORDAN  is a 77 y.o. Caucasian male with male  with hypertension, hypercholesterolemia, permanent A fib and PSVT.   He now presents here for annual follow-up.   He is asymptomatic. Tolerating anticoagulation well.   Past Medical History:  Diagnosis Date   A-fib [facility])    Arthritis    hand   Complication of anesthesia    cardiac arrest   Dysrhythmia    a-fib   Headache    Hyperlipidemia    Hypertension    Past Surgical History:  Procedure Laterality Date   COLONOSCOPY     ETHMOIDECTOMY Right 04/17/2016   Procedure: RIGHT TOTAL ETHMOIDECTOMY;  Surgeon: Leta Baptist, MD;  Location: [facility];  Service: ENT;  Laterality: Right;   FRONTAL SINUS EXPLORATION N/A 04/17/2016   Procedure: FRONTAL RECESS EXPLORATION;  Surgeon: Leta Baptist, MD;  Location: [place];  Service: ENT;  Laterality: N/A;   MAXILLARY ANTROSTOMY Right 04/17/2016   Procedure: RIGHT MAXILLARY ANTROSTOMY WITH TISSUE REMOVAL;  Surgeon: Leta Baptist, MD;  Location: [place];  Service: ENT;  Laterality: Right;   NO PAST SURGERIES     SINUS ENDO WITH FUSION N/A 04/17/2016   Procedure: SINUS ENDO WITH FUSION;  Surgeon: Leta Baptist, MD;  [facility];  Service: ENT;  Laterality: N/A;   Family History  Problem Relation Age of Onset   Stroke Mother    Stroke Father     Social History   Tobacco Use   Smoking status: Never   Smokeless tobacco: Never  Substance Use Topics   Alcohol use: Yes    Comment: social   ROS  Review of Systems  Cardiovascular:  Negative for chest pain, dyspnea on exertion and leg swelling.  Musculoskeletal:  Positive for back pain and joint pain.  Gastrointestinal:  Negative for melena.   Objective  There were no vitals taken for this visit.     04/04/2021   10:06 AM 03/08/2020   10:37 AM 03/09/2019   10:29 AM  Vitals with BMI  Height  6\' 0"  6\' 0"  6\' 0"   Weight 210 lbs 3 oz 213 lbs 6 oz 214 lbs 2 oz  BMI 28.5 [id number]  Systolic [id number]  Diastolic 81 71 76  Pulse 86 78 69     Physical Exam Neck:     Vascular: No JVD.  Cardiovascular:     Rate and Rhythm: Normal rate. Rhythm irregularly irregular.     Pulses: Intact distal pulses.     Heart sounds: Murmur heard.     Early systolic murmur is present with a grade of 2/6.     No gallop.  Pulmonary:     Effort: Pulmonary effort is normal.     Breath sounds: Normal breath sounds.  Abdominal:     General: Bowel sounds are normal.     Palpations: Abdomen is soft.  Musculoskeletal:     Right lower leg: No edema.     Left lower leg: No edema.    Laboratory examination:   External labs:   Labs 12/08/2021:  Total cholesterol 183, triglycerides 214, HDL 46, LDL 94.  Hb 15.0/HCT 44.4, platelets 147.  BUN 19, creatinine 1.09, EGFR 65 mill, potassium 4.5, LFTs normal.  TSH normal at 2.63.  Labs 01/08/2021:  Total  cholesterol 170, triglycerides 151, HDL 41, LDL 99.  Non-HDL cholesterol 129.  BUN 20, creatinine 1.06, EGFR 68 mL, potassium 4.6.  Single, discussed LFT normal.  Labs 05/31/2020:  PSA normal, TSH normal 96 mg, BUN 19, creatinine 1.0, EGFR >60 mL, potassium 4.2.  CMP normal.  PSA normal, TSH normal.  Medications and allergies   Allergies  Allergen Reactions   Penicillins Hives    Has patient had a PCN reaction causing IMMEDIATE RASH, FACIAL/TONGUE/THROAT SWELLING, SOB, OR LIGHTHEADEDNESS WITH HYPOTENSION:  #  #  #  YES  #  #  #   Has patient had a PCN reaction causing severe rash involving mucus membranes or skin necrosis: No Has patient had a PCN reaction that required hospitalization No Has patient had a PCN reaction occurring within the last 10 years: No If all of the above answers are "NO", then may proceed with Cephalosporin use.     Current Outpatient Medications:    acetaminophen (TYLENOL) 500 MG tablet, Take 1,000 mg by mouth every  6 (six) hours as needed for headache., Disp: , Rfl:    lisinopril (PRINIVIL,ZESTRIL) 20 MG tablet, Take 20 mg by mouth daily., Disp: , Rfl:    metoprolol tartrate (LOPRESSOR) 25 MG tablet, TAKE 1 TABLET BY MOUTH DAILY, Disp: 90 tablet, Rfl: 1   simvastatin (ZOCOR) 20 MG tablet, Take 20 mg by mouth daily at 6 PM. , Disp: , Rfl:    XARELTO 20 MG TABS tablet, TAKE 1 TABLET BY MOUTH EVERY EVENING AFTER DINNER, Disp: 90 tablet, Rfl: 3    Radiology:   No results found.  Cardiac Studies:   Event monitor-12/09/2015-01/07/2016-sinus rhythm, occasional PACs, PVCs. Few runs of Paroxysmal atrial fibrillation with rapid ventricular response, maximum heart rate with A. fib was 160/m. Occasional sinus bradycardia, minimum heart rate was 48/m. Patient had reported rapid, fast heartbeat during the runs of atrial fibrillation. He reported "flutter or skipping" during PVCs.  Echo- [id number] Normal LV systolic function with visual EF 55-60%. Left ventricle cavity  is normal in size. Normal global wall motion. Unable to evaluate diastolic  function due to underlying rhythm. Normal LAP.  Left atrial cavity is mildly dilated.  Mild (Grade I) mitral regurgitation.  No other significant valvular abnormalities.  Compared to prior study dated 01/02/2015: no significant change.   Lexiscan myoview stress test 12/12/2015: 1. The resting electrocardiogram demonstrated normal sinus rhythm, RBBB and no resting arrhythmias.  Stress EKG is non-diagnostic for ischemia as it a pharmacologic stress using Lexiscan. There were PACs and one 3 beat atrial ectopic beats during recovery phase with Lexiscan infusion. 2. Myocardial perfusion imaging is normal. Overall left ventricular systolic function was normal without regional wall motion abnormalities. The left ventricular ejection fraction was 77%.  EKG:  *** EKG 04/04/2021: Atrial fibrillation with controlled ventricular response at rate of 80 bpm, normal axis, right bundle  branch block.  No evidence of ischemia.  No significant change from 03/08/2020 and 09/07/2018.  Assessment     ICD-10-CM   1. Permanent atrial fibrillation  I48.21     2. Essential hypertension  I10     3. Hypercholesteremia  E78.00       CHA2DS2-VASc Score is 2.  Yearly risk of stroke: 2.3% (A, HTN).  Score of 1=0.6; 2=2.2; 3=3.2; 4=4.8; 5=7.2; 6=9.8; 7=>9.8) -(CHF; HTN; vasc disease DM,  Male = 1; Age <65 =0; 65-74 = 1,  >75 =2; stroke/embolism= 2).    No orders of the defined types were placed in  this encounter. There are no discontinued medications. No orders of the defined types were placed in this encounter.    Recommendations:   CHARLESON MIKULICH  is a 77 y.o. Caucasian male with male  with hypertension, hypercholesterolemia, permanent A fib and PSVT.   He now presents here for annual follow-up.   He is asymptomatic with regard to atrial fibrillation, hence continue rate control strategy.    I discussed with him regarding anticoagulation, risk benefits, low cardio-embolic risk, patient prefers to be on anticoagulation due to low bleed risk.    Blood pressure is well controlled, reviewed his external labs, lipids are also under excellent control.  No changes in the medications were done today.  I will see him back on annual basis.    Adrian Prows, MD, [facility] 04/06/2022, 7:31 AM Office: [phone] Pager: [phone]

## 2022-04-09 ENCOUNTER — Other Ambulatory Visit: Payer: Self-pay | Admitting: Cardiology

## 2022-04-14 ENCOUNTER — Other Ambulatory Visit: Payer: Self-pay | Admitting: Cardiology

## 2022-04-14 ENCOUNTER — Ambulatory Visit: Payer: Medicare Other | Admitting: Cardiology

## 2022-04-14 ENCOUNTER — Encounter: Payer: Self-pay | Admitting: Cardiology

## 2022-04-14 VITALS — BP 106/74 | HR 76 | Resp 16 | Ht 72.0 in | Wt 208.0 lb

## 2022-04-14 DIAGNOSIS — I4821 Permanent atrial fibrillation: Secondary | ICD-10-CM | POA: Diagnosis not present

## 2022-04-14 DIAGNOSIS — E7800 Pure hypercholesterolemia, unspecified: Secondary | ICD-10-CM

## 2022-04-14 DIAGNOSIS — R9431 Abnormal electrocardiogram [ECG] [EKG]: Secondary | ICD-10-CM

## 2022-04-14 DIAGNOSIS — I10 Essential (primary) hypertension: Secondary | ICD-10-CM

## 2022-04-14 MED ORDER — METOPROLOL SUCCINATE ER 50 MG PO TB24: 50.0000 mg | ORAL_TABLET | Freq: Every day | ORAL | 1 refills | Status: DC

## 2022-04-14 MED ORDER — LISINOPRIL 5 MG PO TABS: 5.0000 mg | ORAL_TABLET | Freq: Every evening | ORAL | 3 refills | Status: DC

## 2022-04-14 NOTE — Progress Notes (Unsigned)
Primary Physician/Referring:  Holland Commons, FNP  Patient ID: Joshua Shields, male    DOB: 04-19-1945, 77 y.o.   MRN: [id number]  No chief complaint on file.  HPI:    KARSON GORDAN  is a 77 y.o. Caucasian male with male  with hypertension, hypercholesterolemia, permanent A fib and PSVT.   He now presents here for annual follow-up.   He is asymptomatic. Tolerating anticoagulation well.   Past Medical History:  Diagnosis Date   A-fib [facility])    Arthritis    hand   Complication of anesthesia    cardiac arrest   Dysrhythmia    a-fib   Headache    Hyperlipidemia    Hypertension    Past Surgical History:  Procedure Laterality Date   COLONOSCOPY     ETHMOIDECTOMY Right 04/17/2016   Procedure: RIGHT TOTAL ETHMOIDECTOMY;  Surgeon: Leta Baptist, MD;  Location: [facility];  Service: ENT;  Laterality: Right;   FRONTAL SINUS EXPLORATION N/A 04/17/2016   Procedure: FRONTAL RECESS EXPLORATION;  Surgeon: Leta Baptist, MD;  Location: [place];  Service: ENT;  Laterality: N/A;   MAXILLARY ANTROSTOMY Right 04/17/2016   Procedure: RIGHT MAXILLARY ANTROSTOMY WITH TISSUE REMOVAL;  Surgeon: Leta Baptist, MD;  Location: [place];  Service: ENT;  Laterality: Right;   NO PAST SURGERIES     SINUS ENDO WITH FUSION N/A 04/17/2016   Procedure: SINUS ENDO WITH FUSION;  Surgeon: Leta Baptist, MD;  [facility];  Service: ENT;  Laterality: N/A;   Family History  Problem Relation Age of Onset   Stroke Mother    Stroke Father     Social History   Tobacco Use   Smoking status: Never   Smokeless tobacco: Never  Substance Use Topics   Alcohol use: Yes    Comment: social   ROS  Review of Systems  Cardiovascular:  Negative for chest pain, dyspnea on exertion and leg swelling.  Musculoskeletal:  Positive for back pain and joint pain.  Gastrointestinal:  Negative for melena.   Objective  There were no vitals taken for this visit.     04/04/2021   10:06 AM 03/08/2020   10:37 AM 03/09/2019   10:29 AM  Vitals with BMI  Height  6\' 0"  6\' 0"  6\' 0"   Weight 210 lbs 3 oz 213 lbs 6 oz 214 lbs 2 oz  BMI 28.5 [id number]  Systolic [id number]  Diastolic 81 71 76  Pulse 86 78 69     Physical Exam Neck:     Vascular: No JVD.  Cardiovascular:     Rate and Rhythm: Normal rate. Rhythm irregularly irregular.     Pulses: Intact distal pulses.     Heart sounds: Murmur heard.     Early systolic murmur is present with a grade of 2/6.     No gallop.  Pulmonary:     Effort: Pulmonary effort is normal.     Breath sounds: Normal breath sounds.  Abdominal:     General: Bowel sounds are normal.     Palpations: Abdomen is soft.  Musculoskeletal:     Right lower leg: No edema.     Left lower leg: No edema.    Laboratory examination:   External labs:   Labs 12/08/2021:  Total cholesterol 183, triglycerides 214, HDL 46, LDL 94.  Hb 15.0/HCT 44.4, platelets 147.  BUN 19, creatinine 1.09, EGFR 65 mill, potassium 4.5, LFTs normal.  TSH normal at 2.63.  Labs 01/08/2021:  Total  cholesterol 170, triglycerides 151, HDL 41, LDL 99.  Non-HDL cholesterol 129.  BUN 20, creatinine 1.06, EGFR 68 mL, potassium 4.6.  Single, discussed LFT normal.  Labs 05/31/2020:  PSA normal, TSH normal 96 mg, BUN 19, creatinine 1.0, EGFR >60 mL, potassium 4.2.  CMP normal.  PSA normal, TSH normal.  Radiology:   No results found.  Cardiac Studies:   Lexiscan myoview stress test 12/12/2015: 1. The resting electrocardiogram demonstrated normal sinus rhythm, RBBB and no resting arrhythmias.  Stress EKG is non-diagnostic for ischemia as it a pharmacologic stress using Lexiscan. There were PACs and one 3 beat atrial ectopic beats during recovery phase with Lexiscan infusion. 2. Myocardial perfusion imaging is normal. Overall left ventricular systolic function was normal without regional wall motion abnormalities. The left ventricular ejection fraction was 77%.  Echo- 03/13/2020 Normal LV systolic function with visual EF 55-60%. Left ventricle  cavity  is normal in size. Normal global wall motion. Unable to evaluate diastolic  function due to underlying rhythm. Normal LAP.  Left atrial cavity is mildly dilated.  Mild (Grade I) mitral regurgitation.  No other significant valvular abnormalities.  Compared to prior study dated 01/02/2015: no significant change.   EKG:  EKG 04/14/2022: Atrial fibrillation with rapid ventricular sponsor at rate of 104 bpm, right axis, right bundle branch block.  No evidence of ischemia.  Compared to 04/04/2021, heart rate is increased from 80 bpm.    Medications and allergies   Allergies  Allergen Reactions   Penicillins Hives    Has patient had a PCN reaction causing IMMEDIATE RASH, FACIAL/TONGUE/THROAT SWELLING, SOB, OR LIGHTHEADEDNESS WITH HYPOTENSION:  #  #  #  YES  #  #  #   Has patient had a PCN reaction causing severe rash involving mucus membranes or skin necrosis: No Has patient had a PCN reaction that required hospitalization No Has patient had a PCN reaction occurring within the last 10 years: No If all of the above answers are "NO", then may proceed with Cephalosporin use.      Current Outpatient Medications:    acetaminophen (TYLENOL) 500 MG tablet, Take 1,000 mg by mouth every 6 (six) hours as needed for headache., Disp: , Rfl:    lisinopril (PRINIVIL,ZESTRIL) 20 MG tablet, Take 20 mg by mouth daily., Disp: , Rfl:    metoprolol tartrate (LOPRESSOR) 25 MG tablet, TAKE 1 TABLET BY MOUTH DAILY, Disp: 90 tablet, Rfl: 1   simvastatin (ZOCOR) 20 MG tablet, Take 20 mg by mouth daily at 6 PM. , Disp: , Rfl:    XARELTO 20 MG TABS tablet, TAKE 1 TABLET BY MOUTH EVERY EVENING AFTER DINNER, Disp: 90 tablet, Rfl: 3   Assessment     ICD-10-CM   1. Permanent atrial fibrillation  I48.21     2. Essential hypertension  I10     3. Hypercholesteremia  E78.00       CHA2DS2-VASc Score is 2.  Yearly risk of stroke: 2.3% (A, HTN).  Score of 1=0.6; 2=2.2; 3=3.2; 4=4.8; 5=7.2; 6=9.8; 7=>9.8) -(CHF;  HTN; vasc disease DM,  Male = 1; Age <65 =0; 65-74 = 1,  >75 =2; stroke/embolism= 2).    No orders of the defined types were placed in this encounter. There are no discontinued medications. No orders of the defined types were placed in this encounter.    Recommendations:   HONORE KOWALIK  is a 77 y.o. Caucasian male with male  with hypertension, hypercholesterolemia, permanent A fib and PSVT.   He  now presents here for annual follow-up.   He is asymptomatic with regard to atrial fibrillation, hence continue rate control strategy.    I discussed with him regarding anticoagulation, risk benefits, low cardio-embolic risk, patient prefers to be on anticoagulation due to low bleed risk.    Blood pressure is well controlled, reviewed his external labs, lipids are also under excellent control.  No changes in the medications were done today.  I will see him back on annual basis.    Yates Decamp, MD, [facility] 04/14/2022, 3:11 PM Office: [phone] Pager: [phone]

## 2022-05-04 ENCOUNTER — Encounter (HOSPITAL_COMMUNITY): Payer: Self-pay

## 2022-05-04 ENCOUNTER — Ambulatory Visit (HOSPITAL_COMMUNITY)
Admission: EM | Admit: 2022-05-04 | Discharge: 2022-05-04 | Disposition: A | Discharge status: Home / Self Care | Payer: Medicare Other | Attending: Family Medicine | Admitting: Family Medicine

## 2022-05-04 DIAGNOSIS — S8012XA Contusion of left lower leg, initial encounter: Secondary | ICD-10-CM

## 2022-05-04 DIAGNOSIS — Z23 Encounter for immunization: Secondary | ICD-10-CM

## 2022-05-04 DIAGNOSIS — S80819A Abrasion, unspecified lower leg, initial encounter: Secondary | ICD-10-CM

## 2022-05-04 MED ORDER — TETANUS-DIPHTH-ACELL PERTUSSIS 5-2.5-18.5 LF-MCG/0.5 IM SUSY
0.5000 mL | PREFILLED_SYRINGE | Freq: Once | INTRAMUSCULAR | Status: AC
  Administered 2022-05-04: 0.5 mL via INTRAMUSCULAR

## 2022-05-04 MED ORDER — TETANUS-DIPHTH-ACELL PERTUSSIS 5-2.5-18.5 LF-MCG/0.5 IM SUSY
PREFILLED_SYRINGE | INTRAMUSCULAR | Status: AC
  Filled 2022-05-04: qty 0.5

## 2022-05-04 MED ORDER — TRAMADOL HCL 50 MG PO TABS: 50.0000 mg | ORAL_TABLET | Freq: Four times a day (QID) | ORAL | 0 refills | Status: DC | PRN

## 2022-05-04 NOTE — ED Triage Notes (Addendum)
Patient states that he hit his left shin on a pole 8 days ago and then hit it again 2 days ago on the bumper on his car. Patient has a hematoma to the left shin and bruising to the left ankle and heel area.  Patient is currently taking Xarelto.  Patient reports taking Tylenol for pain 2 days ago and none today.

## 2022-05-04 NOTE — ED Provider Notes (Signed)
[facility]    CSN: [id number] Arrival date & time: 05/04/22  7829      History   Chief Complaint No chief complaint on file.   HPI Joshua Shields is a 77 y.o. male.   HPI Here for pain and swelling over his left shin.  Approximately 1 week ago he bumped his left shin on a yard stake.  He did have a big bump come up from the bruise and hematoma.  It was getting a little better when he then bumped it on the bumper of his car 2 days ago.  The bump got bigger after that.  It is hurting sometimes.  He has taken Tylenol and is unsure if that has helped.  He has resting it some now he says.  He is on Xarelto for chronic atrial fibrillation.  No fever or chills.  Past Medical History:  Diagnosis Date   A-fib [facility])    Arthritis    hand   Complication of anesthesia    cardiac arrest   Dysrhythmia    a-fib   Headache    Hyperlipidemia    Hypertension     Patient Active Problem List   Diagnosis Date Noted   Hypotension 11/18/2015   Bradycardia 11/18/2015   Sinus congestion 11/18/2015   Hypercholesterolemia 11/18/2015   PAF (paroxysmal atrial fibrillation) (HCC) 11/18/2015    Past Surgical History:  Procedure Laterality Date   COLONOSCOPY     ETHMOIDECTOMY Right 04/17/2016   Procedure: RIGHT TOTAL ETHMOIDECTOMY;  Surgeon: Newman Pies, MD;  [facility];  Service: ENT;  Laterality: Right;   FRONTAL SINUS EXPLORATION N/A 04/17/2016   Procedure: FRONTAL RECESS EXPLORATION;  Surgeon: Newman Pies, MD;  [facility];  Service: ENT;  Laterality: N/A;   MAXILLARY ANTROSTOMY Right 04/17/2016   Procedure: RIGHT MAXILLARY ANTROSTOMY WITH TISSUE REMOVAL;  Surgeon: Newman Pies, MD;  [facility];  Service: ENT;  Laterality: Right;   NO PAST SURGERIES     SINUS ENDO WITH FUSION N/A 04/17/2016   Procedure: SINUS ENDO WITH FUSION;  Surgeon: Newman Pies, MD;  [facility];  Service: ENT;  Laterality: N/A;       Home Medications    Prior to Admission medications   Medication  Sig Start Date End Date Taking? Authorizing Provider  traMADol (ULTRAM) 50 MG tablet Take 1 tablet (50 mg total) by mouth every 6 (six) hours as needed (pain). 05/04/22  Yes Zenia Resides, MD  acetaminophen (TYLENOL) 500 MG tablet Take 1,000 mg by mouth every 6 (six) hours as needed for headache.    [provider]  lisinopril (ZESTRIL) 5 MG tablet Take 1 tablet (5 mg total) by mouth every evening. 04/14/22 04/09/23  Yates Decamp, MD  metoprolol succinate (TOPROL-XL) 50 MG 24 hr tablet TAKE 1 TABLET BY MOUTH DAILY WITH OR IMMEDIATELY FOLLOWING A MEAL 04/15/22   Yates Decamp, MD  simvastatin (ZOCOR) 20 MG tablet Take 20 mg by mouth daily at 6 PM.     [provider]  XARELTO 20 MG TABS tablet TAKE 1 TABLET BY MOUTH EVERY EVENING AFTER DINNER 10/10/21   Yates Decamp, MD    Family History Family History  Problem Relation Age of Onset   Stroke Mother    Stroke Father     Social History Social History   Tobacco Use   Smoking status: Never   Smokeless tobacco: Never  Vaping Use   Vaping Use: Never used  Substance Use Topics  Alcohol use: Yes    Comment: social   Drug use: No     Allergies   Penicillins   Review of Systems Review of Systems   Physical Exam Triage Vital Signs ED Triage Vitals  Enc Vitals Group     BP 05/04/22 1044 124/74     Pulse Rate 05/04/22 1044 79     Resp 05/04/22 1044 16     Temp 05/04/22 1044 98 F (36.7 C)     Temp Source 05/04/22 1044 Oral     SpO2 05/04/22 1044 95 %     Weight --      Height --      Head Circumference --      Peak Flow --      Pain Score 05/04/22 1045 5     Pain Loc --      Pain Edu? --      Excl. in GC? --    No data found.  Updated Vital Signs BP 124/74 (BP Location: Right Arm)   Pulse 79   Temp 98 F (36.7 C) (Oral)   Resp 16   SpO2 95%   Visual Acuity Right Eye Distance:   Left Eye Distance:   Bilateral Distance:    Right Eye Near:   Left Eye Near:    Bilateral Near:     Physical  Exam Vitals reviewed.  Constitutional:      General: He is not in acute distress.    Appearance: He is not ill-appearing, toxic-appearing or diaphoretic.  Musculoskeletal:     Comments: There is an area of mild erythema and bruising that is raised on the left shin, and it is approximately 5 x 5 4 cm in diameter.  There is a soft area of fluctuance on the lateral aspect, that is consistent with hematoma.  There is an eschar that is healing on the medial aspect.  Also he has bruising that is drained down into his left lateral ankle.  Skin:    Coloration: Skin is not pale.  Neurological:     Mental Status: He is alert and oriented to person, place, and time.  Psychiatric:        Behavior: Behavior normal.      UC Treatments / Results  Labs (all labs ordered are listed, but only abnormal results are displayed) Labs Reviewed - No data to display  EKG   Radiology No results found.  Procedures Procedures (including critical care time)  Medications Ordered in UC Medications  Tdap (BOOSTRIX) injection 0.5 mL (has no administration in time range)    Initial Impression / Assessment and Plan / UC Course  I have reviewed the triage vital signs and the nursing notes.  Pertinent labs & imaging results that were available during my care of the patient were reviewed by me and considered in my medical decision making (see chart for details).        I discussed with the patient that we do not drain these hematomas.  Ace compression wrap will be applied, and I am recommending ice and elevation and relative rest for the next 2 or 3 days.  If he wants he can skip 1 dose but 1 dose only of his Xarelto.  The patient cannot recall when his last tetanus booster was.  I cannot see one in his notes from [facility] in care everywhere.  He thinks it has been 10 years or more.  He states that he used to be allergic to "  the old tetanus shot" but he is pretty sure that he has  gotten tetanus booster since that was established that he is not allergic to the current tetanus vaccine.  Tdap is given as his tetanus booster today.  He questioned me as I was finishing his visit, saying he thought I should drain the hematoma.  I discussed with him that this was not a good idea.  If someone drained the hematoma then it would be difficult to get it to stop bleeding from the drain site, with him being on Xarelto. Final Clinical Impressions(s) / UC Diagnoses   Final diagnoses:  Hematoma of left lower leg  Abrasion, leg w/o infection     Discharge Instructions      Please ice and elevate and rest your leg.  You can skip 1 dose of Xarelto, as this might help your clotting mechanisms kicking the gear.  If you want contact your prescribing doctor for the Xarelto to make sure that is okay with them.  If you do skip a dose of Xarelto do not skip more than 1 dose.  If Tylenol is not helping your pain, take tramadol 50 mg-- 1 tablet every 6 hours as needed for pain.  This medication can make you sleepy or dizzy        ED Prescriptions     Medication Sig Dispense Auth. Provider   traMADol (ULTRAM) 50 MG tablet Take 1 tablet (50 mg total) by mouth every 6 (six) hours as needed (pain). 12 tablet Alanmichael Barmore, Janace Aris, MD      I have reviewed the PDMP during this encounter.   Zenia Resides, MD 05/04/22 1113

## 2022-05-04 NOTE — Discharge Instructions (Signed)
Please ice and elevate and rest your leg.  You can skip 1 dose of Xarelto, as this might help your clotting mechanisms kicking the gear.  If you want contact your prescribing doctor for the Xarelto to make sure that is okay with them.  If you do skip a dose of Xarelto do not skip more than 1 dose.  If Tylenol is not helping your pain, take tramadol 50 mg-- 1 tablet every 6 hours as needed for pain.  This medication can make you sleepy or dizzy

## 2022-05-14 ENCOUNTER — Other Ambulatory Visit: Payer: Medicare Other

## 2022-05-26 ENCOUNTER — Ambulatory Visit: Payer: Medicare Other | Admitting: Cardiology

## 2022-06-05 ENCOUNTER — Ambulatory Visit: Payer: Medicare Other

## 2022-06-05 DIAGNOSIS — R9431 Abnormal electrocardiogram [ECG] [EKG]: Secondary | ICD-10-CM | POA: Diagnosis not present

## 2022-06-05 DIAGNOSIS — I4821 Permanent atrial fibrillation: Secondary | ICD-10-CM

## 2022-06-07 NOTE — Progress Notes (Signed)
Stable echo. We may recheck Echo in a year again.   Echocardiogram 06/05/2022: Normal LV systolic function with visual EF 50-55%. Left ventricle cavity is normal in size. Normal left ventricular wall thickness. Normal global wall motion. Unable to evaluate diastolic function due to atrial fibrillation.  Left atrial cavity is mildly dilated at 36.6 ml/m^2. Mild tricuspid regurgitation. No evidence of pulmonary hypertension. Mild pulmonic regurgitation. Compared to 03/13/2020 Mild reduction in LVEF from 55-60% to 50-55%.

## 2022-06-11 ENCOUNTER — Encounter: Payer: Self-pay | Admitting: Cardiology

## 2022-06-11 ENCOUNTER — Ambulatory Visit: Payer: Medicare Other | Admitting: Cardiology

## 2022-06-11 VITALS — BP 128/74 | HR 96 | Resp 16 | Ht 72.0 in | Wt 211.4 lb

## 2022-06-11 DIAGNOSIS — I4821 Permanent atrial fibrillation: Secondary | ICD-10-CM | POA: Diagnosis not present

## 2022-06-11 DIAGNOSIS — I10 Essential (primary) hypertension: Secondary | ICD-10-CM | POA: Diagnosis not present

## 2022-06-11 MED ORDER — METOPROLOL SUCCINATE ER 100 MG PO TB24: 100.0000 mg | ORAL_TABLET | Freq: Every day | ORAL | 3 refills | Status: DC

## 2022-06-11 NOTE — Progress Notes (Signed)
Primary Physician/Referring:  Fatima Sanger, FNP  Patient ID: Joshua Shields, male    DOB: 1945-07-21, 77 y.o.   MRN: [id number]  Chief Complaint  Patient presents with  . Permanent atrial fibrillation (HCC)  . Follow-up   HPI:    Joshua Shields  is a 77 y.o. Caucasian male with male  with hypertension, hypercholesterolemia, permanent A fib and PSVT.   He now presents here for annual follow-up. He is asymptomatic with regard to atrial fibrillation.  I had seen him 6 weeks ago, he was in A-fib with RVR, I did increase the dose of the metoprolol succinate to 50 mg daily and decreased lisinopril to 5 mg daily.  He now presents for follow-up.  Underwent echocardiogram.  Remains asymptomatic.  Past Medical History:  Diagnosis Date  . A-fib (HCC)   . Arthritis    hand  . Complication of anesthesia    cardiac arrest  . Dysrhythmia    a-fib  . Headache   . Hyperlipidemia   . Hypertension    Past Surgical History:  Procedure Laterality Date  . COLONOSCOPY    . ETHMOIDECTOMY Right 04/17/2016   Procedure: RIGHT TOTAL ETHMOIDECTOMY;  Surgeon: Newman Pies, MD;  [facility];  Service: ENT;  Laterality: Right;  . FRONTAL SINUS EXPLORATION N/A 04/17/2016   Procedure: FRONTAL RECESS EXPLORATION;  Surgeon: Newman Pies, MD;  [facility];  Service: ENT;  Laterality: N/A;  . MAXILLARY ANTROSTOMY Right 04/17/2016   Procedure: RIGHT MAXILLARY ANTROSTOMY WITH TISSUE REMOVAL;  Surgeon: Newman Pies, MD;  [facility];  Service: ENT;  Laterality: Right;  . NO PAST SURGERIES    . SINUS ENDO WITH FUSION N/A 04/17/2016   Procedure: SINUS ENDO WITH FUSION;  Surgeon: Newman Pies, MD;  [facility];  Service: ENT;  Laterality: N/A;   Family History  Problem Relation Age of Onset  . Stroke Mother   . Stroke Father     Social History   Tobacco Use  . Smoking status: Never  . Smokeless tobacco: Never  Substance Use Topics  . Alcohol use: Yes    Comment: social   ROS  Review of Systems   Cardiovascular:  Negative for chest pain, dyspnea on exertion and leg swelling.  Gastrointestinal:  Negative for melena.   Objective  Blood pressure 128/74, pulse 96, resp. rate 16, height 6' (1.829 m), weight 211 lb 6.4 oz (95.9 kg), SpO2 96 %.     06/11/2022   11:35 AM 05/04/2022   10:44 AM 04/14/2022    4:00 PM  Vitals with BMI  Height 6\' 0"   6\' 0"   Weight 211 lbs 6 oz  208 lbs  BMI 28.66  28.2  Systolic 128 124 409  Diastolic 74 74 74  Pulse 96 79 76     Physical Exam Neck:     Vascular: No JVD.  Cardiovascular:     Rate and Rhythm: Normal rate.     Pulses: Intact distal pulses.     Heart sounds: Murmur heard.     Early systolic murmur is present with a grade of 2/6.     No gallop.  Pulmonary:     Effort: Pulmonary effort is normal.     Breath sounds: Normal breath sounds.  Abdominal:     General: Bowel sounds are normal.     Palpations: Abdomen is soft.  Musculoskeletal:     Right lower leg: No edema.     Left lower leg: No  edema.   Laboratory examination:   External labs:   Labs 12/08/2021:  Total cholesterol 183, triglycerides 214, HDL 46, LDL 94.  Hb 15.0/HCT 44.4, platelets 147.  BUN 19, creatinine 1.09, EGFR 65 mill, potassium 4.5, LFTs normal.  TSH normal at 2.63.  Labs 01/08/2021:  Total cholesterol 170, triglycerides 151, HDL 41, LDL 99.  Non-HDL cholesterol 129.  BUN 20, creatinine 1.06, EGFR 68 mL, potassium 4.6.  Single, discussed LFT normal.  Labs 05/31/2020:  PSA normal, TSH normal 96 mg, BUN 19, creatinine 1.0, EGFR >60 mL, potassium 4.2.  CMP normal.  PSA normal, TSH normal.  Radiology:   No results found.  Cardiac Studies:   Lexiscan myoview stress test 12/12/2015: 1. The resting electrocardiogram demonstrated normal sinus rhythm, RBBB and no resting arrhythmias.  Stress EKG is non-diagnostic for ischemia as it a pharmacologic stress using Lexiscan. There were PACs and one 3 beat atrial ectopic beats during recovery phase with Lexiscan  infusion. 2. Myocardial perfusion imaging is normal. Overall left ventricular systolic function was normal without regional wall motion abnormalities. The left ventricular ejection fraction was 77%.  Echocardiogram 06/05/2022: Normal LV systolic function with visual EF 50-55%. Left ventricle cavity is normal in size. Normal left ventricular wall thickness. Normal global wall motion. Unable to evaluate diastolic function due to atrial fibrillation.  Left atrial cavity is mildly dilated at 36.6 ml/m^2. Mild tricuspid regurgitation. No evidence of pulmonary hypertension. Mild pulmonic regurgitation. Compared to 03/13/2020 Mild reduction in LVEF from 55-60% to 50-55%  EKG:  EKG 06/11/2022: Atrial fibrillation with controlled ventricular response at the rate of 95 bpm, normal axis, right bundle branch block.  Compared to 04/14/2022, A-fib with RVR at 104 bpm, rate is better controlled.  Medications and allergies   Allergies  Allergen Reactions  . Penicillins Hives    Has patient had a PCN reaction causing IMMEDIATE RASH, FACIAL/TONGUE/THROAT SWELLING, SOB, OR LIGHTHEADEDNESS WITH HYPOTENSION:  #  #  #  YES  #  #  #   Has patient had a PCN reaction causing severe rash involving mucus membranes or skin necrosis: No Has patient had a PCN reaction that required hospitalization No Has patient had a PCN reaction occurring within the last 10 years: No If all of the above answers are "NO", then may proceed with Cephalosporin use.      Current Outpatient Medications:  .  acetaminophen (TYLENOL) 500 MG tablet, Take 1,000 mg by mouth every 6 (six) hours as needed for headache., Disp: , Rfl:  .  lisinopril (ZESTRIL) 5 MG tablet, Take 1 tablet (5 mg total) by mouth every evening., Disp: 90 tablet, Rfl: 3 .  metoprolol succinate (TOPROL-XL) 50 MG 24 hr tablet, TAKE 1 TABLET BY MOUTH DAILY WITH OR IMMEDIATELY FOLLOWING A MEAL, Disp: 90 tablet, Rfl: 3 .  simvastatin (ZOCOR) 20 MG tablet, Take 20 mg by mouth  daily at 6 PM. , Disp: , Rfl:  .  XARELTO 20 MG TABS tablet, TAKE 1 TABLET BY MOUTH EVERY EVENING AFTER DINNER, Disp: 90 tablet, Rfl: 3   Assessment     ICD-10-CM   1. Permanent atrial fibrillation (HCC)  I48.21 EKG 12-Lead    2. Essential hypertension  I10       CHA2DS2-VASc Score is 2.  Yearly risk of stroke: 2.3% (A, HTN).  Score of 1=0.6; 2=2.2; 3=3.2; 4=4.8; 5=7.2; 6=9.8; 7=>9.8) -(CHF; HTN; vasc disease DM,  Male = 1; Age <65 =0; 65-74 = 1,  >75 =2; stroke/embolism= 2).  No orders of the defined types were placed in this encounter.  Medications Discontinued During This Encounter  Medication Reason  . traMADol (ULTRAM) 50 MG tablet Patient Preference    Orders Placed This Encounter  Procedures  . EKG 12-Lead     Recommendations:   CHA PRESS  is a 77 y.o. Caucasian male with male  with hypertension, hypercholesterolemia, permanent A fib and PSVT.   He now presents here for annual follow-up. He is asymptomatic with regard to atrial fibrillation.  1. Permanent atrial fibrillation (HCC) On his last office visit on 04/13/2024, 6 weeks ago, he had A-fib with RVR.  I did increase the dose of the metoprolol succinate to 50 mg daily and reduce the dose ACE inhibitor.  He is tolerating the medication well, heart rate is improved.  I reviewed the echocardiogram, his LVEF is low normal now, previously was 60 to 65%.  In view of decreased LVEF, I would like to further increase metoprolol succinate to 100 mg daily, if heart rate continues to be elevated, patient will continue to monitor this closely, will go back to reducing the dose of metoprolol succinate to 50 mg and add verapamil to get heart rate controlled.  He has permanent atrial fibrillation and is tolerating anticoagulation well.  I will repeat echocardiogram in 6 months and see him back at that time.  - EKG 12-Lead - metoprolol succinate (TOPROL-XL) 100 MG 24 hr tablet; Take 1 tablet (100 mg total) by mouth daily. Take  with or immediately following a meal.  Dispense: 90 tablet; Refill: 3 - PCV ECHOCARDIOGRAM COMPLETE; Future  2. Essential hypertension With regard to hypertension, he is well-controlled with Zestril 5 mg daily and also about send metoprolol succinate.  Continue the same for now.    Yates Decamp, MD, [facility] 06/11/2022, 12:07 PM Office: [phone] Pager: [phone]

## 2022-06-17 ENCOUNTER — Encounter: Payer: Self-pay | Admitting: Cardiology

## 2022-06-17 NOTE — Telephone Encounter (Signed)
From patient.

## 2022-06-22 NOTE — Telephone Encounter (Signed)
From patient.

## 2022-07-16 DIAGNOSIS — R5383 Other fatigue: Secondary | ICD-10-CM | POA: Diagnosis not present

## 2022-07-16 DIAGNOSIS — I10 Essential (primary) hypertension: Secondary | ICD-10-CM | POA: Diagnosis not present

## 2022-07-16 DIAGNOSIS — R319 Hematuria, unspecified: Secondary | ICD-10-CM | POA: Diagnosis not present

## 2022-07-16 DIAGNOSIS — E7800 Pure hypercholesterolemia, unspecified: Secondary | ICD-10-CM | POA: Diagnosis not present

## 2022-07-23 DIAGNOSIS — Z7901 Long term (current) use of anticoagulants: Secondary | ICD-10-CM | POA: Diagnosis not present

## 2022-07-23 DIAGNOSIS — E785 Hyperlipidemia, unspecified: Secondary | ICD-10-CM | POA: Diagnosis not present

## 2022-07-23 DIAGNOSIS — Z0000 Encounter for general adult medical examination without abnormal findings: Secondary | ICD-10-CM | POA: Diagnosis not present

## 2022-07-23 DIAGNOSIS — I10 Essential (primary) hypertension: Secondary | ICD-10-CM | POA: Diagnosis not present

## 2022-07-23 DIAGNOSIS — I4891 Unspecified atrial fibrillation: Secondary | ICD-10-CM | POA: Diagnosis not present

## 2022-07-24 ENCOUNTER — Encounter: Payer: Self-pay | Admitting: Cardiology

## 2022-07-25 NOTE — Progress Notes (Signed)
Labs 07/16/2022:  Hb 17.0/HCT 48.6, platelets 183.  Serum glucose: 101 mg, BUN 17, creatinine 1.05, EGFR 74 mL, potassium 4.7, LFTs normal.  Total cholesterol 191, triglycerides 199, HDL 47, LDL 109.

## 2022-07-30 DIAGNOSIS — Z1212 Encounter for screening for malignant neoplasm of rectum: Secondary | ICD-10-CM | POA: Diagnosis not present

## 2022-07-30 DIAGNOSIS — Z1211 Encounter for screening for malignant neoplasm of colon: Secondary | ICD-10-CM | POA: Diagnosis not present

## 2022-08-04 LAB — COLOGUARD: COLOGUARD: POSITIVE — AB

## 2022-08-10 DIAGNOSIS — I10 Essential (primary) hypertension: Secondary | ICD-10-CM | POA: Diagnosis not present

## 2022-08-10 DIAGNOSIS — R195 Other fecal abnormalities: Secondary | ICD-10-CM | POA: Diagnosis not present

## 2022-08-10 DIAGNOSIS — I480 Paroxysmal atrial fibrillation: Secondary | ICD-10-CM | POA: Diagnosis not present

## 2022-08-11 ENCOUNTER — Other Ambulatory Visit: Payer: Self-pay | Admitting: Gastroenterology

## 2022-08-28 ENCOUNTER — Encounter: Payer: Self-pay | Admitting: Cardiology

## 2022-09-17 NOTE — Anesthesia Preprocedure Evaluation (Addendum)
Anesthesia Evaluation  Patient identified by MRN, date of birth, ID band Patient awake    Reviewed: Allergy & Precautions, H&P , NPO status , Patient's Chart, lab work & pertinent test results  History of Anesthesia Complications Negative for: history of anesthetic complications  Airway Mallampati: II  TM Distance: >3 FB Neck ROM: Full    Dental no notable dental hx. (+) Dental Advisory Given   Pulmonary neg pulmonary ROS   Pulmonary exam normal breath sounds clear to auscultation       Cardiovascular hypertension, Normal cardiovascular exam+ dysrhythmias Atrial Fibrillation  Rhythm:Regular Rate:Normal     Neuro/Psych  Headaches  negative psych ROS   GI/Hepatic negative GI ROS, Neg liver ROS,,,  Endo/Other  negative endocrine ROS    Renal/GU negative Renal ROS  negative genitourinary   Musculoskeletal  (+) Arthritis ,    Abdominal   Peds negative pediatric ROS (+)  Hematology negative hematology ROS (+)   Anesthesia Other Findings   Reproductive/Obstetrics negative OB ROS                              Anesthesia Physical Anesthesia Plan  ASA: 3  Anesthesia Plan: MAC   Post-op Pain Management:    Induction: Intravenous  PONV Risk Score and Plan: Propofol infusion and Treatment may vary due to age or medical condition  Airway Management Planned: Natural Airway  Additional Equipment:   Intra-op Plan:   Post-operative Plan:   Informed Consent: I have reviewed the patients History and Physical, chart, labs and discussed the procedure including the risks, benefits and alternatives for the proposed anesthesia with the patient or authorized representative who has indicated his/her understanding and acceptance.     Dental advisory given  Plan Discussed with: CRNA  Anesthesia Plan Comments:        Anesthesia Quick Evaluation

## 2022-09-18 ENCOUNTER — Encounter (HOSPITAL_COMMUNITY)
Admission: RE | Disposition: A | Discharge status: Home / Self Care | Payer: Self-pay | Source: Home / Self Care | Attending: Gastroenterology

## 2022-09-18 ENCOUNTER — Ambulatory Visit (HOSPITAL_BASED_OUTPATIENT_CLINIC_OR_DEPARTMENT_OTHER): Payer: Medicare Other

## 2022-09-18 ENCOUNTER — Ambulatory Visit (HOSPITAL_COMMUNITY)
Admission: RE | Admit: 2022-09-18 | Discharge: 2022-09-18 | Disposition: A | Discharge status: Home / Self Care | Payer: Medicare Other | Attending: Gastroenterology | Admitting: Gastroenterology

## 2022-09-18 ENCOUNTER — Encounter (HOSPITAL_COMMUNITY): Payer: Self-pay | Admitting: Gastroenterology

## 2022-09-18 ENCOUNTER — Ambulatory Visit (HOSPITAL_COMMUNITY): Payer: Medicare Other

## 2022-09-18 ENCOUNTER — Other Ambulatory Visit: Payer: Self-pay

## 2022-09-18 DIAGNOSIS — D122 Benign neoplasm of ascending colon: Secondary | ICD-10-CM | POA: Insufficient documentation

## 2022-09-18 DIAGNOSIS — I10 Essential (primary) hypertension: Secondary | ICD-10-CM

## 2022-09-18 DIAGNOSIS — R195 Other fecal abnormalities: Secondary | ICD-10-CM | POA: Insufficient documentation

## 2022-09-18 DIAGNOSIS — Z1211 Encounter for screening for malignant neoplasm of colon: Secondary | ICD-10-CM | POA: Diagnosis not present

## 2022-09-18 DIAGNOSIS — Z7901 Long term (current) use of anticoagulants: Secondary | ICD-10-CM | POA: Insufficient documentation

## 2022-09-18 DIAGNOSIS — D120 Benign neoplasm of cecum: Secondary | ICD-10-CM | POA: Insufficient documentation

## 2022-09-18 DIAGNOSIS — D124 Benign neoplasm of descending colon: Secondary | ICD-10-CM | POA: Diagnosis not present

## 2022-09-18 DIAGNOSIS — K5730 Diverticulosis of large intestine without perforation or abscess without bleeding: Secondary | ICD-10-CM | POA: Insufficient documentation

## 2022-09-18 DIAGNOSIS — I480 Paroxysmal atrial fibrillation: Secondary | ICD-10-CM | POA: Insufficient documentation

## 2022-09-18 DIAGNOSIS — K635 Polyp of colon: Secondary | ICD-10-CM | POA: Diagnosis not present

## 2022-09-18 HISTORY — PX: COLONOSCOPY WITH PROPOFOL: SHX5780

## 2022-09-18 HISTORY — PX: POLYPECTOMY: SHX5525

## 2022-09-18 SURGERY — COLONOSCOPY WITH PROPOFOL
Anesthesia: Monitor Anesthesia Care

## 2022-09-18 MED ORDER — LIDOCAINE 2% (20 MG/ML) 5 ML SYRINGE
INTRAMUSCULAR | Status: DC | PRN
  Administered 2022-09-18: 80 mg via INTRAVENOUS

## 2022-09-18 MED ORDER — LACTATED RINGERS IV SOLN
INTRAVENOUS | Status: DC
  Administered 2022-09-18: 1000 mL via INTRAVENOUS

## 2022-09-18 MED ORDER — PROPOFOL 10 MG/ML IV BOLUS
INTRAVENOUS | Status: DC | PRN
  Administered 2022-09-18: 40 mg via INTRAVENOUS

## 2022-09-18 MED ORDER — PROPOFOL 500 MG/50ML IV EMUL
INTRAVENOUS | Status: DC | PRN
  Administered 2022-09-18: 200 ug/kg/min via INTRAVENOUS

## 2022-09-18 SURGICAL SUPPLY — 22 items

## 2022-09-18 NOTE — H&P (Signed)
Joshua Shields HPI: At this time the patient denies any problems with nausea, vomiting, fevers, chills, abdominal pain, diarrhea, constipation, hematochezia, melena, GERD, or dysphagia. The patient denies any known family history of colon cancers. No complaints of chest pain, SOB, MI, or sleep apnea.  With routine testing the patient was positive for his recent Cologuard.  Over the years he was tested with Cologuard and this was the first time that he was positive.  He uses Xarelto for his PAF and this is managed by Dr. Jacinto Halim.  Past Medical History:  Diagnosis Date   A-fib [facility])    Arthritis    hand   Complication of anesthesia    cardiac arrest   Dysrhythmia    a-fib   Headache    Hyperlipidemia    Hypertension     Past Surgical History:  Procedure Laterality Date   COLONOSCOPY     ETHMOIDECTOMY Right 04/17/2016   Procedure: RIGHT TOTAL ETHMOIDECTOMY;  Surgeon: Newman Pies, MD;  Location: [facility] OR;  Service: ENT;  Laterality: Right;   FRONTAL SINUS EXPLORATION N/A 04/17/2016   Procedure: FRONTAL RECESS EXPLORATION;  Surgeon: Newman Pies, MD;  Location: [facility] OR;  Service: ENT;  Laterality: N/A;   MAXILLARY ANTROSTOMY Right 04/17/2016   Procedure: RIGHT MAXILLARY ANTROSTOMY WITH TISSUE REMOVAL;  Surgeon: Newman Pies, MD;  Location: [facility] OR;  Service: ENT;  Laterality: Right;   NO PAST SURGERIES     SINUS ENDO WITH FUSION N/A 04/17/2016   Procedure: SINUS ENDO WITH FUSION;  Surgeon: Newman Pies, MD;  Location: [facility] OR;  Service: ENT;  Laterality: N/A;    Family History  Problem Relation Age of Onset   Stroke Mother    Stroke Father     Social History:  reports that he has never smoked. He has never used smokeless tobacco. He reports current alcohol use. He reports that he does not use drugs.  Allergies:  Allergies  Allergen Reactions   Penicillins Hives    Has patient had a PCN reaction causing IMMEDIATE RASH, FACIAL/TONGUE/THROAT SWELLING, SOB, OR LIGHTHEADEDNESS WITH HYPOTENSION:  #  #  #  YES  #  #  #    Has patient had a PCN reaction causing severe rash involving mucus membranes or skin necrosis: No Has patient had a PCN reaction that required hospitalization No Has patient had a PCN reaction occurring within the last 10 years: No If all of the above answers are "NO", then may proceed with Cephalosporin use.     Medications: Scheduled: Continuous:  lactated ringers 1,000 mL (09/18/22 0825)    No results found for this or any previous visit (from the past 24 hour(s)).   No results found.  ROS:  As stated above in the HPI otherwise negative.  Blood pressure (!) 135/98, pulse (!) 103, temperature (!) 97.3 F (36.3 C), temperature source Temporal, resp. rate 15, height 6' (1.829 m), weight 91.6 kg, SpO2 96%.    PE: Gen: NAD, Alert and Oriented HEENT:  [place]/AT, EOMI Neck: Supple, no LAD Lungs: CTA Bilaterally CV: RRR without M/G/R ABD: Soft, NTND, +BS Ext: No C/C/E  Assessment/Plan: 1) Positive Cologuard - colonoscopy.  Avyanna Spada D 09/18/2022, 8:50 AM

## 2022-09-18 NOTE — Op Note (Signed)
[facility] Patient Name: Joshua Shields Procedure Date: 09/18/2022 MRN: [id number] Attending MD: Jeani Hawking , MD, [id number] Date of Birth: [birth year] CSN: [id number] Age: 77 Admit Type: Outpatient Procedure:                Colonoscopy Indications:              Positive Cologuard test Providers:                Jeani Hawking, MD, Eliberto Ivory, RN, Salley Scarlet,                            Technician, Romie Minus, CRNA Referring MD:              Medicines:                Propofol per Anesthesia Complications:            No immediate complications. Estimated Blood Loss:     Estimated blood loss: none. Procedure:                Pre-Anesthesia Assessment:                           - Prior to the procedure, a History and Physical                            was performed, and patient medications and                            allergies were reviewed. The patient's tolerance of                            previous anesthesia was also reviewed. The risks                            and benefits of the procedure and the sedation                            options and risks were discussed with the patient.                            All questions were answered, and informed consent                            was obtained. Prior Anticoagulants: The patient has                            taken Eliquis (apixaban), last dose was 4 days                            prior to procedure. ASA Grade Assessment: III - A                            patient with severe systemic disease. After  reviewing the risks and benefits, the patient was                            deemed in satisfactory condition to undergo the                            procedure.                           - Sedation was administered by an anesthesia                            professional. Deep sedation was attained.                           After obtaining informed consent, the colonoscope                             was passed under direct vision. Throughout the                            procedure, the patient's blood pressure, pulse, and                            oxygen saturations were monitored continuously. The                            CF-HQ190L ([id number]) Olympus colonoscope was                            introduced through the anus and advanced to the the                            cecum, identified by appendiceal orifice and                            ileocecal valve. The colonoscopy was performed                            without difficulty. The patient tolerated the                            procedure well. The quality of the bowel                            preparation was evaluated using the BBPS [facility]                            Bowel Preparation Scale) with scores of: Right                            Colon = 2 (minor amount of residual staining, small  fragments of stool and/or opaque liquid, but mucosa                            seen well), Transverse Colon = 3 (entire mucosa                            seen well with no residual staining, small                            fragments of stool or opaque liquid) and Left Colon                            = 2 (minor amount of residual staining, small                            fragments of stool and/or opaque liquid, but mucosa                            seen well). The total BBPS score equals 7. The                            quality of the bowel preparation was good. The                            ileocecal valve, appendiceal orifice, and rectum                            were photographed. Scope In: 9:12:39 AM Scope Out: 9:34:54 AM Scope Withdrawal Time: 0 hours 19 minutes 4 seconds  Total Procedure Duration: 0 hours 22 minutes 15 seconds  Findings:      Seven sessile polyps were found in the descending colon, ascending colon       and cecum. The polyps were 2 to 3 mm in size. These  polyps were removed       with a cold snare. Resection and retrieval were complete.      Scattered medium-mouthed and small-mouthed diverticula were found in the       sigmoid colon. Impression:               - Seven 2 to 3 mm polyps in the descending colon,                            in the ascending colon and in the cecum, removed                            with a cold snare. Resected and retrieved.                           - Diverticulosis in the sigmoid colon. Moderate Sedation:      Not Applicable - Patient had care per Anesthesia. Recommendation:           - Patient has a contact number available for  emergencies. The signs and symptoms of potential                            delayed complications were discussed with the                            patient. Return to normal activities tomorrow.                            Written discharge instructions were provided to the                            patient.                           - Resume previous diet.                           - Continue present medications.                           - Await pathology results.                           - Repeat colonoscopy in 3 years for surveillance.                           - Resume Eliquis. Procedure Code(s):        --- Professional ---                           [phone], Colonoscopy, flexible; with removal of                            tumor(s), polyp(s), or other lesion(s) by snare                            technique Diagnosis Code(s):        --- Professional ---                           D12.4, Benign neoplasm of descending colon                           D12.2, Benign neoplasm of ascending colon                           D12.0, Benign neoplasm of cecum                           R19.5, Other fecal abnormalities                           K57.30, Diverticulosis of large intestine without                            perforation or abscess without bleeding CPT  copyright 2022 American  Medical Association. All rights reserved. The codes documented in this report are preliminary and upon coder review may  be revised to meet current compliance requirements. Jeani Hawking, MD Jeani Hawking, MD 09/18/2022 9:49:30 AM This report has been signed electronically. Number of Addenda: 0

## 2022-09-18 NOTE — Anesthesia Procedure Notes (Signed)
Procedure Name: MAC Date/Time: 09/18/2022 9:10 AM  Performed by: Lovie Chol, CRNAPre-anesthesia Checklist: Patient identified, Emergency Drugs available, Suction available, Patient being monitored and Timeout performed Oxygen Delivery Method: Simple face mask

## 2022-09-18 NOTE — Discharge Instructions (Signed)
YOU HAD AN ENDOSCOPIC PROCEDURE TODAY: Refer to the procedure report and other information in the discharge instructions given to you for any specific questions about what was found during the examination. If this information does not answer your questions, please call [facility] at [phone] to clarify.   YOU SHOULD EXPECT: Some feelings of bloating in the abdomen. Passage of more gas than usual. Walking can help get rid of the air that was put into your GI tract during the procedure and reduce the bloating. If you had a lower endoscopy (such as a colonoscopy or flexible sigmoidoscopy) you may notice spotting of blood in your stool or on the toilet paper. Some abdominal soreness may be present for a day or two, also.  DIET: Your first meal following the procedure should be a light meal and then it is ok to progress to your normal diet. A half-sandwich or bowl of soup is an example of a good first meal. Heavy or fried foods are harder to digest and may make you feel nauseous or bloated. Drink plenty of fluids but you should avoid alcoholic beverages for 24 hours. If you had an esophageal dilation, please see attached information for diet.   ACTIVITY: Your care partner should take you home directly after the procedure. You should plan to take it easy, moving slowly for the rest of the day. You can resume normal activity the day after the procedure however YOU SHOULD NOT DRIVE, use power tools, machinery or perform tasks that involve climbing or major physical exertion for 24 hours (because of the sedation medicines used during the test).   SYMPTOMS TO REPORT IMMEDIATELY: A gastroenterologist can be reached at any hour. Please call [phone]  for any of the following symptoms:  Following lower endoscopy (colonoscopy, flexible sigmoidoscopy) Excessive amounts of blood in the stool  Significant tenderness, worsening of abdominal pains  Swelling of the abdomen that is new, acute  Fever of  100 or higher  Following upper endoscopy (EGD, EUS, ERCP, esophageal dilation) Vomiting of blood or coffee ground material  New, significant abdominal pain  New, significant chest pain or pain under the shoulder blades  Painful or persistently difficult swallowing  New shortness of breath  Black, tarry-looking or red, bloody stools  FOLLOW UP:  If any biopsies were taken you will be contacted by phone or by letter within the next 1-3 weeks. Call [phone]  if you have not heard about the biopsies in 3 weeks.  Please also call with any specific questions about appointments or follow up tests.YOU HAD AN ENDOSCOPIC PROCEDURE TODAY: Refer to the procedure report and other information in the discharge instructions given to you for any specific questions about what was found during the examination. If this information does not answer your questions, please call [facility] at [phone] to clarify.   YOU SHOULD EXPECT: Some feelings of bloating in the abdomen. Passage of more gas than usual. Walking can help get rid of the air that was put into your GI tract during the procedure and reduce the bloating. If you had a lower endoscopy (such as a colonoscopy or flexible sigmoidoscopy) you may notice spotting of blood in your stool or on the toilet paper. Some abdominal soreness may be present for a day or two, also.  DIET: Your first meal following the procedure should be a light meal and then it is ok to progress to your normal diet. A half-sandwich or bowl of soup is an example of  a good first meal. Heavy or fried foods are harder to digest and may make you feel nauseous or bloated. Drink plenty of fluids but you should avoid alcoholic beverages for 24 hours. If you had an esophageal dilation, please see attached information for diet.   ACTIVITY: Your care partner should take you home directly after the procedure. You should plan to take it easy, moving slowly for the rest of the day. You can  resume normal activity the day after the procedure however YOU SHOULD NOT DRIVE, use power tools, machinery or perform tasks that involve climbing or major physical exertion for 24 hours (because of the sedation medicines used during the test).   SYMPTOMS TO REPORT IMMEDIATELY: A gastroenterologist can be reached at any hour. Please call [phone]  for any of the following symptoms:  Following lower endoscopy (colonoscopy, flexible sigmoidoscopy) Excessive amounts of blood in the stool  Significant tenderness, worsening of abdominal pains  Swelling of the abdomen that is new, acute  Fever of 100 or higher   FOLLOW UP:  If any biopsies were taken you will be contacted by phone or by letter within the next 1-3 weeks. Call [phone]  if you have not heard about the biopsies in 3 weeks.  Please also call with any specific questions about appointments or follow up tests.

## 2022-09-18 NOTE — Transfer of Care (Signed)
Immediate Anesthesia Transfer of Care Note  Patient: Joshua Shields  Procedure(s) Performed: COLONOSCOPY WITH PROPOFOL POLYPECTOMY  Patient Location: Endoscopy Unit  Anesthesia Type:MAC  Level of Consciousness: sedated and drowsy  Airway & Oxygen Therapy: Patient Spontanous Breathing and Patient connected to face mask oxygen  Post-op Assessment: Report given to RN and Post -op Vital signs reviewed and stable  Post vital signs: Reviewed  Last Vitals:  Vitals Value Taken Time  BP    Temp    Pulse 123 09/18/22 0947  Resp 14 09/18/22 0947  SpO2 97 % 09/18/22 0947  Vitals shown include unfiled device data.  Last Pain:  Vitals:   09/18/22 0828  TempSrc: Temporal  PainSc: 0-No pain         Complications: No notable events documented.

## 2022-09-18 NOTE — Anesthesia Postprocedure Evaluation (Signed)
Anesthesia Post Note  Patient: Joshua Shields  Procedure(s) Performed: COLONOSCOPY WITH PROPOFOL POLYPECTOMY     Patient location during evaluation: PACU Anesthesia Type: MAC Level of consciousness: awake and alert Pain management: pain level controlled Vital Signs Assessment: post-procedure vital signs reviewed and stable Respiratory status: spontaneous breathing, nonlabored ventilation, respiratory function stable and patient connected to nasal cannula oxygen Cardiovascular status: stable and blood pressure returned to baseline Postop Assessment: no apparent nausea or vomiting Anesthetic complications: no   No notable events documented.  Last Vitals:  Vitals:   09/18/22 0950 09/18/22 1000  BP: (!) 102/56 122/74  Pulse: 67 91  Resp: 12 (!) 24  Temp:    SpO2: 99% 94%    Last Pain:  Vitals:   09/18/22 1000  TempSrc:   PainSc: Asleep                 Sheridan Nation

## 2022-09-21 ENCOUNTER — Encounter (HOSPITAL_COMMUNITY): Payer: Self-pay | Admitting: Gastroenterology

## 2022-09-21 LAB — SURGICAL PATHOLOGY

## 2022-10-05 ENCOUNTER — Other Ambulatory Visit: Payer: Self-pay

## 2022-10-05 MED ORDER — RIVAROXABAN 20 MG PO TABS: 20.0000 mg | ORAL_TABLET | Freq: Every day | ORAL | 3 refills | Status: DC

## 2022-10-05 NOTE — Telephone Encounter (Signed)
Prescription refill request for Xarelto received.  Indication:AFIB Last office visit:6/24 Weight:91.6  KG Age:77 Scr:1.05  7/24 CrCl:76.33  ml/min  Prescription refilled

## 2022-10-28 DIAGNOSIS — L570 Actinic keratosis: Secondary | ICD-10-CM | POA: Diagnosis not present

## 2022-10-28 DIAGNOSIS — L720 Epidermal cyst: Secondary | ICD-10-CM | POA: Diagnosis not present

## 2022-10-28 DIAGNOSIS — Z85828 Personal history of other malignant neoplasm of skin: Secondary | ICD-10-CM | POA: Diagnosis not present

## 2022-10-28 DIAGNOSIS — L820 Inflamed seborrheic keratosis: Secondary | ICD-10-CM | POA: Diagnosis not present

## 2022-10-28 DIAGNOSIS — D225 Melanocytic nevi of trunk: Secondary | ICD-10-CM | POA: Diagnosis not present

## 2022-10-28 DIAGNOSIS — L905 Scar conditions and fibrosis of skin: Secondary | ICD-10-CM | POA: Diagnosis not present

## 2022-10-28 DIAGNOSIS — L821 Other seborrheic keratosis: Secondary | ICD-10-CM | POA: Diagnosis not present

## 2022-12-01 ENCOUNTER — Ambulatory Visit (HOSPITAL_COMMUNITY): Payer: Medicare Other | Attending: Cardiology

## 2022-12-01 ENCOUNTER — Other Ambulatory Visit: Payer: Medicare Other

## 2022-12-01 ENCOUNTER — Encounter (HOSPITAL_COMMUNITY): Payer: Self-pay

## 2022-12-01 DIAGNOSIS — I4821 Permanent atrial fibrillation: Secondary | ICD-10-CM | POA: Insufficient documentation

## 2022-12-01 LAB — ECHOCARDIOGRAM COMPLETE: S' Lateral: 2.8 cm

## 2022-12-02 NOTE — Progress Notes (Signed)
Normal heart function. Stable echo

## 2022-12-09 ENCOUNTER — Ambulatory Visit: Payer: Self-pay | Admitting: Cardiology

## 2023-01-18 DIAGNOSIS — E785 Hyperlipidemia, unspecified: Secondary | ICD-10-CM | POA: Diagnosis not present

## 2023-01-18 LAB — LAB REPORT - SCANNED: EGFR: 78

## 2023-01-25 DIAGNOSIS — E7800 Pure hypercholesterolemia, unspecified: Secondary | ICD-10-CM | POA: Diagnosis not present

## 2023-01-25 DIAGNOSIS — I480 Paroxysmal atrial fibrillation: Secondary | ICD-10-CM | POA: Diagnosis not present

## 2023-01-25 DIAGNOSIS — I10 Essential (primary) hypertension: Secondary | ICD-10-CM | POA: Diagnosis not present

## 2023-01-25 DIAGNOSIS — Z7901 Long term (current) use of anticoagulants: Secondary | ICD-10-CM | POA: Diagnosis not present

## 2023-01-25 DIAGNOSIS — M25511 Pain in right shoulder: Secondary | ICD-10-CM | POA: Diagnosis not present

## 2023-01-25 NOTE — Progress Notes (Signed)
Labs 01/18/2023:  Serum glucose 93 mg, BUN 22, creatinine 0.99, EGFR 78 mL, potassium 4.5, LFTs normal.  Total cholesterol 177, triglycerides 249, HDL 44, LDL 91.  TSH normal at 2.310.

## 2023-02-03 ENCOUNTER — Encounter: Payer: Self-pay | Admitting: Cardiology

## 2023-02-03 ENCOUNTER — Ambulatory Visit: Payer: Medicare Other | Attending: Cardiology | Admitting: Cardiology

## 2023-02-03 VITALS — BP 110/82 | HR 70 | Resp 16 | Ht 72.0 in | Wt 211.2 lb

## 2023-02-03 DIAGNOSIS — I10 Essential (primary) hypertension: Secondary | ICD-10-CM | POA: Diagnosis not present

## 2023-02-03 DIAGNOSIS — I4821 Permanent atrial fibrillation: Secondary | ICD-10-CM | POA: Diagnosis not present

## 2023-02-03 DIAGNOSIS — E782 Mixed hyperlipidemia: Secondary | ICD-10-CM

## 2023-02-03 NOTE — Progress Notes (Signed)
Cardiology Office Note:  .   Date:  02/03/2023  ID:  Joshua Shields, DOB [birth year], MRN [id number] PCP: Fatima Sanger, FNP  [facility] Providers Cardiologist:  Yates Decamp, MD   History of Present Illness: Joshua Shields Kitchen   Joshua Shields is a 78 y.o. Caucasian male with male with hypertension, hypercholesterolemia, permanent A fib and PSVT. He now presents here for annual follow-up. He is asymptomatic with regard to atrial fibrillation.   Discussed the use of AI scribe software for clinical note transcription with the patient, who gave verbal consent to proceed.  History of Present Illness   The patient, with permanent atrial fibrillation and mixed hyperlipidemia, presents for a cardiology follow-up.  His heart rate has been stable and well-controlled with metoprolol succinate 100 mg daily for heart rate control.  He has mixed hyperlipidemia and is on simvastatin, which has kept his cholesterol levels within target range, with an LDL of 91 mg/dL. However, his triglycerides are slightly elevated. He consumes a little cheese daily, typically in sandwiches, but denies eating desserts, fried foods, or having a diet high in starch.  He is on lisinopril 5 mg and metoprolol succinate 100 mg once a day for blood pressure management, which is well-controlled at 100/82 mmHg. His kidney function is reported as normal.  He is on blood thinners and has not experienced any dark or black stools. His last hemoglobin check was about a year and a half ago, and he is due for a blood count check. No dark or black stools.       Labs   External Labs:  Labs 01/18/2023:   Serum glucose 93 mg, BUN 22, creatinine 0.99, EGFR 78 mL, potassium 4.5, LFTs normal.   Total cholesterol 177, triglycerides 249, HDL 44, LDL 91.   TSH normal at 2.310.  Labs 01/18/2023:  Total cholesterol 177, triglycerides 249, HDL 44, LDL 91.  Left 07/21/2022:  Hb 17.0/HCT 48.6, platelets 163.  Normal indicis.  Review of  Systems  Cardiovascular:  Negative for chest pain, dyspnea on exertion and leg swelling.    Physical Exam:   VS:  BP 110/82 (BP Location: Right Arm, Patient Position: Sitting, Cuff Size: Large)   Pulse 70   Resp 16   Ht 6' (1.829 m)   Wt 211 lb 3.2 oz (95.8 kg)   SpO2 95%   BMI 28.64 kg/m    Wt Readings from Last 3 Encounters:  02/03/23 211 lb 3.2 oz (95.8 kg)  09/18/22 202 lb (91.6 kg)  06/11/22 211 lb 6.4 oz (95.9 kg)    Physical Exam Neck:     Vascular: No carotid bruit or JVD.  Cardiovascular:     Rate and Rhythm: Normal rate. Rhythm irregular.     Pulses: Normal pulses and intact distal pulses.     Heart sounds: No murmur heard. Pulmonary:     Effort: Pulmonary effort is normal.     Breath sounds: Normal breath sounds.  Abdominal:     General: Bowel sounds are normal.     Palpations: Abdomen is soft.  Musculoskeletal:     Right lower leg: No edema.     Left lower leg: No edema.  Skin:    Capillary Refill: Capillary refill takes less than 2 seconds.     Studies Reviewed: Joshua Shields Kitchen    ECHOCARDIOGRAM COMPLETE 12/01/2022  1. Left ventricular ejection fraction, by estimation, is 60 to 65%. The left ventricle has normal function. The left ventricle has no regional wall motion  abnormalities. Left ventricular diastolic function could not be evaluated. 2. Right ventricular systolic function is low normal. The right ventricular size is normal. Tricuspid regurgitation signal is inadequate for assessing PA pressure. 3. Left atrial size was mildly dilated. 4. The mitral valve is grossly normal. Mild mitral valve regurgitation. No evidence of mitral stenosis. 5. The aortic valve is tricuspid. Aortic valve regurgitation is not visualized. No aortic stenosis is present. 6. The inferior vena cava is normal in size with <50% respiratory variability, suggesting right atrial pressure of 8 mmHg.  EKG:         EKG 06/11/2022: Atrial fibrillation with controlled ventricular response at the  rate of 95 bpm, normal axis, right bundle branch block.   Medications and allergies    Allergies  Allergen Reactions   Penicillins Hives    Has patient had a PCN reaction causing IMMEDIATE RASH, FACIAL/TONGUE/THROAT SWELLING, SOB, OR LIGHTHEADEDNESS WITH HYPOTENSION:  #  #  #  YES  #  #  #   Has patient had a PCN reaction causing severe rash involving mucus membranes or skin necrosis: No Has patient had a PCN reaction that required hospitalization No Has patient had a PCN reaction occurring within the last 10 years: No If all of the above answers are "NO", then may proceed with Cephalosporin use.      Current Outpatient Medications:    acetaminophen (TYLENOL) 500 MG tablet, Take 1,000 mg by mouth every 6 (six) hours as needed for headache., Disp: , Rfl:    lisinopril (ZESTRIL) 5 MG tablet, Take 1 tablet (5 mg total) by mouth every evening., Disp: 90 tablet, Rfl: 3   metoprolol succinate (TOPROL-XL) 100 MG 24 hr tablet, Take 1 tablet (100 mg total) by mouth daily. Take with or immediately following a meal., Disp: 90 tablet, Rfl: 3   rivaroxaban (XARELTO) 20 MG TABS tablet, Take 1 tablet (20 mg total) by mouth daily with supper., Disp: 90 tablet, Rfl: 3   simvastatin (ZOCOR) 20 MG tablet, Take 20 mg by mouth daily at 6 PM. , Disp: , Rfl:    ASSESSMENT AND PLAN: .      ICD-10-CM   1. Permanent atrial fibrillation (HCC)  I48.21     2. Essential hypertension  I10     3. Mixed hyperlipidemia  E78.2      Assessment and Plan    Permanent Atrial Fibrillation Permanent atrial fibrillation with well-controlled heart rate. Managed with metoprolol succinate 100 mg daily. Emphasized the importance of regular monitoring due to anticoagulant use. - Continue metoprolol succinate 100 mg daily  Hypertension Hypertension is well-controlled with current medications. Blood pressure is 100/82 mmHg. Kidney function is normal. - Continue lisinopril 5 mg daily - Continue metoprolol succinate 100 mg  daily  Mixed Hyperlipidemia Mixed hyperlipidemia with total cholesterol at 177 mg/dL, LDL at 91 mg/dL, and slightly elevated triglycerides. Managed with simvastatin. Discussed dietary modifications to reduce triglycerides, including reducing cheese intake and limiting bread and pasta consumption. No additional medications needed as there are no blockages or other heart issues. - Continue simvastatin - Reduce cheese intake - Limit bread and pasta consumption  General Health Maintenance Routine health maintenance is up to date. Labs from PCP show normal kidney function, cholesterol, and TSH. Hemoglobin not checked recently; last hemoglobin was a year and a half ago. Emphasized the importance of regular blood count monitoring due to anticoagulant use. - Order blood count to monitor hemoglobin levels  Follow-up - Follow-up in one year or sooner  if needed.      Signed,  Yates Decamp, MD, [facility] 02/03/2023, 8:44 AM [facility] [address] [place], [place] [address] Phone: [phone]. Fax:  [phone]

## 2023-02-03 NOTE — Patient Instructions (Signed)
Medication Instructions:  Your physician recommends that you continue on your current medications as directed. Please refer to the Current Medication list given to you today.  *If you need a refill on your cardiac medications before your next appointment, please call your pharmacy*   Lab Work: none If you have labs (blood work) drawn today and your tests are completely normal, you will receive your results only by: MyChart Message (if you have MyChart) OR A paper copy in the mail If you have any lab test that is abnormal or we need to change your treatment, we will call you to review the results.   Testing/Procedures: none   Follow-Up: At [facility], you and your health needs are our priority.  As part of our continuing mission to provide you with exceptional heart care, we have created designated Provider Care Teams.  These Care Teams include your primary Cardiologist (physician) and Advanced Practice Providers (APPs -  Physician Assistants and Nurse Practitioners) who all work together to provide you with the care you need, when you need it.  We recommend signing up for the patient portal called "MyChart".  Sign up information is provided on this After Visit Summary.  MyChart is used to connect with patients for Virtual Visits (Telemedicine).  Patients are able to view lab/test results, encounter notes, upcoming appointments, etc.  Non-urgent messages can be sent to your provider as well.   To learn more about what you can do with MyChart, go to [email or web address].    Your next appointment:   12 month(s)  Provider:   Yates Decamp, MD     Other Instructions

## 2023-02-19 DIAGNOSIS — M25511 Pain in right shoulder: Secondary | ICD-10-CM | POA: Diagnosis not present

## 2023-06-25 ENCOUNTER — Other Ambulatory Visit: Payer: Self-pay | Admitting: Cardiology

## 2023-06-25 DIAGNOSIS — I4821 Permanent atrial fibrillation: Secondary | ICD-10-CM

## 2023-06-25 DIAGNOSIS — I10 Essential (primary) hypertension: Secondary | ICD-10-CM

## 2023-07-27 DIAGNOSIS — E7800 Pure hypercholesterolemia, unspecified: Secondary | ICD-10-CM | POA: Diagnosis not present

## 2023-07-27 DIAGNOSIS — E785 Hyperlipidemia, unspecified: Secondary | ICD-10-CM | POA: Diagnosis not present

## 2023-07-27 DIAGNOSIS — Z0000 Encounter for general adult medical examination without abnormal findings: Secondary | ICD-10-CM | POA: Diagnosis not present

## 2023-07-27 DIAGNOSIS — I10 Essential (primary) hypertension: Secondary | ICD-10-CM | POA: Diagnosis not present

## 2023-07-27 DIAGNOSIS — R5383 Other fatigue: Secondary | ICD-10-CM | POA: Diagnosis not present

## 2023-07-27 LAB — LAB REPORT - SCANNED: EGFR: 71

## 2023-08-03 DIAGNOSIS — R718 Other abnormality of red blood cells: Secondary | ICD-10-CM | POA: Diagnosis not present

## 2023-08-03 DIAGNOSIS — E7800 Pure hypercholesterolemia, unspecified: Secondary | ICD-10-CM | POA: Diagnosis not present

## 2023-08-03 DIAGNOSIS — Z0000 Encounter for general adult medical examination without abnormal findings: Secondary | ICD-10-CM | POA: Diagnosis not present

## 2023-08-03 DIAGNOSIS — I480 Paroxysmal atrial fibrillation: Secondary | ICD-10-CM | POA: Diagnosis not present

## 2023-08-03 DIAGNOSIS — Z7901 Long term (current) use of anticoagulants: Secondary | ICD-10-CM | POA: Diagnosis not present

## 2023-08-03 DIAGNOSIS — I10 Essential (primary) hypertension: Secondary | ICD-10-CM | POA: Diagnosis not present

## 2023-10-28 DIAGNOSIS — L723 Sebaceous cyst: Secondary | ICD-10-CM | POA: Diagnosis not present

## 2023-10-28 DIAGNOSIS — L570 Actinic keratosis: Secondary | ICD-10-CM | POA: Diagnosis not present

## 2023-10-28 DIAGNOSIS — L820 Inflamed seborrheic keratosis: Secondary | ICD-10-CM | POA: Diagnosis not present

## 2023-10-28 DIAGNOSIS — Z85828 Personal history of other malignant neoplasm of skin: Secondary | ICD-10-CM | POA: Diagnosis not present

## 2023-10-28 DIAGNOSIS — L821 Other seborrheic keratosis: Secondary | ICD-10-CM | POA: Diagnosis not present

## 2023-11-08 ENCOUNTER — Other Ambulatory Visit: Payer: Self-pay | Admitting: Cardiology

## 2023-11-08 MED ORDER — RIVAROXABAN 20 MG PO TABS: 20.0000 mg | ORAL_TABLET | Freq: Every day | ORAL | 3 refills | Status: AC

## 2023-11-08 NOTE — Telephone Encounter (Signed)
 Prescription refill request for Xarelto  received.  Indication:afib Last office visit:1/25 Weight:95.8  kg Age:78 Scr:1.07  7/25 CrCl:77.10  ml/min  Prescription refilled

## 2024-01-25 LAB — LAB REPORT - SCANNED: EGFR: 73
